# Patient Record
Sex: Female | Born: 1965 | Race: White | Hispanic: No | Marital: Single | Smoking: Former smoker
Health system: Southern US, Community
[De-identification: ages and names within clinical notes are randomized; demographics above are authoritative.]

## PROBLEM LIST (undated history)

## (undated) DIAGNOSIS — I10 Essential (primary) hypertension: Secondary | ICD-10-CM

## (undated) DIAGNOSIS — E119 Type 2 diabetes mellitus without complications: Secondary | ICD-10-CM

## (undated) DIAGNOSIS — F329 Major depressive disorder, single episode, unspecified: Secondary | ICD-10-CM

## (undated) DIAGNOSIS — J309 Allergic rhinitis, unspecified: Secondary | ICD-10-CM

## (undated) DIAGNOSIS — E669 Obesity, unspecified: Secondary | ICD-10-CM

## (undated) DIAGNOSIS — E7800 Pure hypercholesterolemia, unspecified: Secondary | ICD-10-CM

## (undated) DIAGNOSIS — D500 Iron deficiency anemia secondary to blood loss (chronic): Secondary | ICD-10-CM

## (undated) DIAGNOSIS — F32A Depression, unspecified: Secondary | ICD-10-CM

## (undated) DIAGNOSIS — F419 Anxiety disorder, unspecified: Secondary | ICD-10-CM

## (undated) DIAGNOSIS — K219 Gastro-esophageal reflux disease without esophagitis: Secondary | ICD-10-CM

## (undated) DIAGNOSIS — N189 Chronic kidney disease, unspecified: Secondary | ICD-10-CM

## (undated) HISTORY — DX: Pure hypercholesterolemia, unspecified: E78.00

## (undated) HISTORY — DX: Major depressive disorder, single episode, unspecified: F32.9

## (undated) HISTORY — PX: APPENDECTOMY: SHX54

## (undated) HISTORY — DX: Chronic kidney disease, unspecified: N18.9

## (undated) HISTORY — DX: Anxiety disorder, unspecified: F41.9

## (undated) HISTORY — DX: Iron deficiency anemia secondary to blood loss (chronic): D50.0

## (undated) HISTORY — PX: TUBAL LIGATION: SHX77

## (undated) HISTORY — DX: Essential (primary) hypertension: I10

## (undated) HISTORY — PX: CYST EXCISION: SHX5701

## (undated) HISTORY — DX: Gastro-esophageal reflux disease without esophagitis: K21.9

## (undated) HISTORY — PX: TONSILLECTOMY: SUR1361

## (undated) HISTORY — DX: Type 2 diabetes mellitus without complications: E11.9

## (undated) HISTORY — DX: Allergic rhinitis, unspecified: J30.9

## (undated) HISTORY — DX: Depression, unspecified: F32.A

## (undated) HISTORY — DX: Obesity, unspecified: E66.9

---

## 2005-01-27 ENCOUNTER — Ambulatory Visit: Payer: Self-pay | Admitting: General Practice

## 2007-10-02 ENCOUNTER — Emergency Department: Payer: Self-pay | Admitting: Emergency Medicine

## 2007-10-17 ENCOUNTER — Ambulatory Visit: Payer: Self-pay | Admitting: Internal Medicine

## 2008-01-08 ENCOUNTER — Emergency Department: Payer: Self-pay | Admitting: Emergency Medicine

## 2010-04-10 ENCOUNTER — Ambulatory Visit: Payer: Self-pay | Admitting: General Surgery

## 2010-04-10 ENCOUNTER — Ambulatory Visit: Payer: Self-pay | Admitting: Family Medicine

## 2012-10-19 ENCOUNTER — Ambulatory Visit: Payer: Self-pay

## 2013-07-22 ENCOUNTER — Emergency Department: Payer: Self-pay | Admitting: Emergency Medicine

## 2013-07-22 LAB — COMPREHENSIVE METABOLIC PANEL
Alkaline Phosphatase: 100 U/L (ref 50–136)
Anion Gap: 6 — ABNORMAL LOW (ref 7–16)
BUN: 16 mg/dL (ref 7–18)
Bilirubin,Total: 0.2 mg/dL (ref 0.2–1.0)
Calcium, Total: 9.2 mg/dL (ref 8.5–10.1)
Chloride: 106 mmol/L (ref 98–107)
Co2: 26 mmol/L (ref 21–32)
Osmolality: 278 (ref 275–301)
Total Protein: 7.2 g/dL (ref 6.4–8.2)

## 2013-07-22 LAB — TROPONIN I: Troponin-I: <0.02 ng/mL

## 2013-07-22 LAB — URINALYSIS, COMPLETE
Bacteria: NONE SEEN
Bilirubin,UR: NEGATIVE
Ketone: NEGATIVE
Leukocyte Esterase: NEGATIVE
Nitrite: NEGATIVE
Ph: 5 (ref 4.5–8.0)
Protein: NEGATIVE
RBC,UR: 5 /HPF (ref 0–5)
Squamous Epithelial: 4

## 2013-07-22 LAB — CBC
HCT: 39.5 % (ref 35.0–47.0)
HGB: 13.5 g/dL (ref 12.0–16.0)
Platelet: 303 10*3/uL (ref 150–440)
WBC: 10.2 10*3/uL (ref 3.6–11.0)

## 2013-07-22 LAB — LIPASE, BLOOD: Lipase: 90 U/L (ref 73–393)

## 2013-10-17 ENCOUNTER — Emergency Department: Payer: Self-pay | Admitting: Emergency Medicine

## 2013-11-15 ENCOUNTER — Ambulatory Visit: Payer: Self-pay

## 2013-12-01 ENCOUNTER — Ambulatory Visit (INDEPENDENT_AMBULATORY_CARE_PROVIDER_SITE_OTHER): Payer: Commercial Managed Care - PPO

## 2013-12-01 ENCOUNTER — Ambulatory Visit (INDEPENDENT_AMBULATORY_CARE_PROVIDER_SITE_OTHER): Payer: Commercial Managed Care - PPO | Admitting: Podiatry

## 2013-12-01 ENCOUNTER — Other Ambulatory Visit: Payer: Self-pay | Admitting: Podiatry

## 2013-12-01 ENCOUNTER — Encounter: Payer: Self-pay | Admitting: Podiatry

## 2013-12-01 VITALS — BP 172/89 | HR 80 | Resp 16 | Ht 62.0 in | Wt 270.0 lb

## 2013-12-01 DIAGNOSIS — M109 Gout, unspecified: Secondary | ICD-10-CM

## 2013-12-01 DIAGNOSIS — M79609 Pain in unspecified limb: Secondary | ICD-10-CM

## 2013-12-01 DIAGNOSIS — M779 Enthesopathy, unspecified: Secondary | ICD-10-CM

## 2013-12-01 DIAGNOSIS — M79672 Pain in left foot: Secondary | ICD-10-CM

## 2013-12-01 LAB — SEDIMENTATION RATE: Erythrocyte Sed Rate: 13 mm/hr (ref 0–20)

## 2013-12-01 LAB — URIC ACID: Uric Acid: 4.5 mg/dL (ref 2.6–6.0)

## 2013-12-01 MED ORDER — TRIAMCINOLONE ACETONIDE 10 MG/ML IJ SUSP
10.0000 mg | Freq: Once | INTRAMUSCULAR | Status: AC
  Administered 2013-12-01: 10 mg

## 2013-12-01 NOTE — Progress Notes (Signed)
Subjective:     Patient ID: Eileen Ritter, female   DOB: [birth year], 47 y.o.   MRN: [id number]  Foot Pain   patient presents stating I'm having a lot of pain in my feet. States it started in the right ankle and it is now in the left arch and right arch and pain across the entire foot itself. States it's been present for about 6 weeks   Review of Systems  All other systems reviewed and are negative.       Objective:   Physical Exam  Nursing note and vitals reviewed. Constitutional: She is oriented to person, place, and time.  Cardiovascular: Intact distal pulses.   Musculoskeletal: Normal range of motion.  Neurological: She is oriented to person, place, and time.  Skin: Skin is warm.   neurovascular status intact with range of motion adequate subtalar and midtarsal joint and normal muscle strength. I noted there to be tenderness in the sinus tarsi right and the ankle left with pain in the arch left over right. Swelling is also noted in a generalized fashion     Assessment:     Sinus tarsitis right and plantar fasciitis left over right mid arch along with possibility for systemic gout or arthritis    Plan:     H&P and x-rays reviewed with patient. I am sending for arthritic profile in today I injected the right ankle 3 mg Kenalog 5 of Xylocaine Marcaine mixture into the sinus tarsi and the left medial fascial band 3 mg Kenalog 5 of Xylocaine Marcaine mixture and placed on 12 day Sterapred Dosepak. Patient will be reevaluated in one week to discuss blood work results

## 2013-12-01 NOTE — Progress Notes (Signed)
   Subjective:    Patient ID: Eileen Ritter, female    DOB: [birth year], 47 y.o.   MRN: [id number]  HPI Comments: N pain L right foot arch/heel   Left foot arch D nov 2014 O sudden C better A walking, standing T pt wraps feet, soaks in hot water, ice, asper cream, icy hot, diclofenac, mobic, went to er, x-rays,    Foot Pain      Review of Systems  Constitutional: Positive for activity change.  HENT: Negative.   Eyes: Negative.   Respiratory: Negative.   Cardiovascular: Negative.   Gastrointestinal: Negative.   Endocrine: Negative.   Genitourinary: Negative.   Musculoskeletal:       Joint pain Difficulty walking  Skin: Negative.   Allergic/Immunologic: Negative.   Neurological: Negative.   Hematological: Negative.   Psychiatric/Behavioral: Negative.        Objective:   Physical Exam        Assessment & Plan:

## 2013-12-08 ENCOUNTER — Encounter: Payer: Self-pay | Admitting: Podiatry

## 2013-12-08 ENCOUNTER — Ambulatory Visit (INDEPENDENT_AMBULATORY_CARE_PROVIDER_SITE_OTHER): Payer: Commercial Managed Care - PPO | Admitting: Podiatry

## 2013-12-08 VITALS — BP 127/81 | HR 86 | Resp 16

## 2013-12-08 DIAGNOSIS — M7750 Other enthesopathy of unspecified foot: Secondary | ICD-10-CM

## 2013-12-08 NOTE — Progress Notes (Signed)
Subjective:     Patient ID: Eileen Ritter, female   DOB: 04/05/1966, 47 y.o.   MRN: [id number]  HPI patient states my feet failed so much better and I wanted to review my blood work  Review of Systems     Objective:   Physical Exam Neurovascular status intact with significant edema reduction and discomfort noted to be minimal with palpation to the sinus tarsi right and plantar heel left    Assessment:     Dramatic improvement from inflammation and pain of both feet    Plan:     Reviewed blood work indicating elevated C. reactive protein and that all other numbers were normal. Gait instructions on physical therapy supportive shoe gear in 2 reappoint if symptoms indicate

## 2013-12-08 NOTE — Progress Notes (Signed)
   Subjective:    Patient ID: Eileen Ritter, female    DOB: 10-03-1966, 47 y.o.   MRN: [id number]  HPI Comments: 100% better      Review of Systems     Objective:   Physical Exam        Assessment & Plan:

## 2014-03-30 ENCOUNTER — Ambulatory Visit (INDEPENDENT_AMBULATORY_CARE_PROVIDER_SITE_OTHER): Payer: Commercial Managed Care - PPO | Admitting: Podiatry

## 2014-03-30 VITALS — Resp 16 | Ht 63.0 in | Wt 274.0 lb

## 2014-03-30 DIAGNOSIS — M7750 Other enthesopathy of unspecified foot: Secondary | ICD-10-CM

## 2014-03-30 DIAGNOSIS — M722 Plantar fascial fibromatosis: Secondary | ICD-10-CM

## 2014-03-30 DIAGNOSIS — M7660 Achilles tendinitis, unspecified leg: Secondary | ICD-10-CM

## 2014-03-30 MED ORDER — PREDNISONE 10 MG PO KIT: 10.0000 mg | PACK | ORAL | Status: DC

## 2014-03-30 MED ORDER — TRIAMCINOLONE ACETONIDE 10 MG/ML IJ SUSP
10.0000 mg | Freq: Once | INTRAMUSCULAR | Status: AC
  Administered 2014-03-30: 10 mg

## 2014-03-30 NOTE — Patient Instructions (Signed)
Plantar Fasciitis (Heel Spur Syndrome)  with Rehab  The plantar fascia is a fibrous, ligament-like, soft-tissue structure that spans the bottom of the foot. Plantar fasciitis is a condition that causes pain in the foot due to inflammation of the tissue.  SYMPTOMS   · Pain and tenderness on the underneath side of the foot.  · Pain that worsens with standing or walking.  CAUSES   Plantar fasciitis is caused by irritation and injury to the plantar fascia on the underneath side of the foot. Common mechanisms of injury include:  · Direct trauma to bottom of the foot.  · Damage to a small nerve that runs under the foot where the main fascia attaches to the heel bone.  · Stress placed on the plantar fascia due to bone spurs.  RISK INCREASES WITH:   · Activities that place stress on the plantar fascia (running, jumping, pivoting, or cutting).  · Poor strength and flexibility.  · Improperly fitted shoes.  · Tight calf muscles.  · Flat feet.  · Failure to warm-up properly before activity.  · Obesity.  PREVENTION  · Warm up and stretch properly before activity.  · Allow for adequate recovery between workouts.  · Maintain physical fitness:  · Strength, flexibility, and endurance.  · Cardiovascular fitness.  · Maintain a health body weight.  · Avoid stress on the plantar fascia.  · Wear properly fitted shoes, including arch supports for individuals who have flat feet.  PROGNOSIS   If treated properly, then the symptoms of plantar fasciitis usually resolve without surgery. However, occasionally surgery is necessary.  RELATED COMPLICATIONS   · Recurrent symptoms that may result in a chronic condition.  · Problems of the lower back that are caused by compensating for the injury, such as limping.  · Pain or weakness of the foot during push-off following surgery.  · Chronic inflammation, scarring, and partial or complete fascia tear, occurring more often from repeated injections.  TREATMENT   Treatment initially involves the use of  ice and medication to help reduce pain and inflammation. The use of strengthening and stretching exercises may help reduce pain with activity, especially stretches of the Achilles tendon. These exercises may be performed at home or with a therapist. Your caregiver may recommend that you use heel cups of arch supports to help reduce stress on the plantar fascia. Occasionally, corticosteroid injections are given to reduce inflammation. If symptoms persist for greater than 6 months despite non-surgical (conservative), then surgery may be recommended.   MEDICATION   · If pain medication is necessary, then nonsteroidal anti-inflammatory medications, such as aspirin and ibuprofen, or other minor pain relievers, such as acetaminophen, are often recommended.  · Do not take pain medication within 7 days before surgery.  · Prescription pain relievers may be given if deemed necessary by your caregiver. Use only as directed and only as much as you need.  · Corticosteroid injections may be given by your caregiver. These injections should be reserved for the most serious cases, because they may only be given a certain number of times.  HEAT AND COLD  · Cold treatment (icing) relieves pain and reduces inflammation. Cold treatment should be applied for 10 to 15 minutes every 2 to 3 hours for inflammation and pain and immediately after any activity that aggravates your symptoms. Use ice packs or massage the area with a piece of ice (ice massage).  · Heat treatment may be used prior to performing the stretching and strengthening activities prescribed   by your caregiver, physical therapist, or athletic trainer. Use a heat pack or soak the injury in warm water.  SEEK IMMEDIATE MEDICAL CARE IF:  · Treatment seems to offer no benefit, or the condition worsens.  · Any medications produce adverse side effects.  EXERCISES  RANGE OF MOTION (ROM) AND STRETCHING EXERCISES - Plantar Fasciitis (Heel Spur Syndrome)  These exercises may help you  when beginning to rehabilitate your injury. Your symptoms may resolve with or without further involvement from your physician, physical therapist or athletic trainer. While completing these exercises, remember:   · Restoring tissue flexibility helps normal motion to return to the joints. This allows healthier, less painful movement and activity.  · An effective stretch should be held for at least 30 seconds.  · A stretch should never be painful. You should only feel a gentle lengthening or release in the stretched tissue.  RANGE OF MOTION - Toe Extension, Flexion  · Sit with your right / left leg crossed over your opposite knee.  · Grasp your toes and gently pull them back toward the top of your foot. You should feel a stretch on the bottom of your toes and/or foot.  · Hold this stretch for __________ seconds.  · Now, gently pull your toes toward the bottom of your foot. You should feel a stretch on the top of your toes and or foot.  · Hold this stretch for __________ seconds.  Repeat __________ times. Complete this stretch __________ times per day.   RANGE OF MOTION - Ankle Dorsiflexion, Active Assisted  · Remove shoes and sit on a chair that is preferably not on a carpeted surface.  · Place right / left foot under knee. Extend your opposite leg for support.  · Keeping your heel down, slide your right / left foot back toward the chair until you feel a stretch at your ankle or calf. If you do not feel a stretch, slide your bottom forward to the edge of the chair, while still keeping your heel down.  · Hold this stretch for __________ seconds.  Repeat __________ times. Complete this stretch __________ times per day.   STRETCH  Gastroc, Standing  · Place hands on wall.  · Extend right / left leg, keeping the front knee somewhat bent.  · Slightly point your toes inward on your back foot.  · Keeping your right / left heel on the floor and your knee straight, shift your weight toward the wall, not allowing your back to  arch.  · You should feel a gentle stretch in the right / left calf. Hold this position for __________ seconds.  Repeat __________ times. Complete this stretch __________ times per day.  STRETCH  Soleus, Standing  · Place hands on wall.  · Extend right / left leg, keeping the other knee somewhat bent.  · Slightly point your toes inward on your back foot.  · Keep your right / left heel on the floor, bend your back knee, and slightly shift your weight over the back leg so that you feel a gentle stretch deep in your back calf.  · Hold this position for __________ seconds.  Repeat __________ times. Complete this stretch __________ times per day.  STRETCH  Gastrocsoleus, Standing   Note: This exercise can place a lot of stress on your foot and ankle. Please complete this exercise only if specifically instructed by your caregiver.   · Place the ball of your right / left foot on a step, keeping   your other foot firmly on the same step.  · Hold on to the wall or a rail for balance.  · Slowly lift your other foot, allowing your body weight to press your heel down over the edge of the step.  · You should feel a stretch in your right / left calf.  · Hold this position for __________ seconds.  · Repeat this exercise with a slight bend in your right / left knee.  Repeat __________ times. Complete this stretch __________ times per day.   STRENGTHENING EXERCISES - Plantar Fasciitis (Heel Spur Syndrome)   These exercises may help you when beginning to rehabilitate your injury. They may resolve your symptoms with or without further involvement from your physician, physical therapist or athletic trainer. While completing these exercises, remember:   · Muscles can gain both the endurance and the strength needed for everyday activities through controlled exercises.  · Complete these exercises as instructed by your physician, physical therapist or athletic trainer. Progress the resistance and repetitions only as guided.  STRENGTH - Towel  Curls  · Sit in a chair positioned on a non-carpeted surface.  · Place your foot on a towel, keeping your heel on the floor.  · Pull the towel toward your heel by only curling your toes. Keep your heel on the floor.  · If instructed by your physician, physical therapist or athletic trainer, add ____________________ at the end of the towel.  Repeat __________ times. Complete this exercise __________ times per day.  STRENGTH - Ankle Inversion  · Secure one end of a rubber exercise band/tubing to a fixed object (table, pole). Loop the other end around your foot just before your toes.  · Place your fists between your knees. This will focus your strengthening at your ankle.  · Slowly, pull your big toe up and in, making sure the band/tubing is positioned to resist the entire motion.  · Hold this position for __________ seconds.  · Have your muscles resist the band/tubing as it slowly pulls your foot back to the starting position.  Repeat __________ times. Complete this exercises __________ times per day.   Document Released: 11/30/2005 Document Revised: 02/22/2012 Document Reviewed: 03/14/2009  [employer]® Patient Information ©2014 [employer].

## 2014-04-01 NOTE — Progress Notes (Signed)
Subjective:     Patient ID: Eileen Ritter, female   DOB: January 25, 1966, 48 y.o.   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 At this time careful injections were administered and I scanned for custom orthotics in order to lift the plantar arch

## 2014-04-12 ENCOUNTER — Encounter: Payer: Self-pay | Admitting: *Deleted

## 2014-04-12 NOTE — Progress Notes (Signed)
SENT PT POSTCARD LETTING HER KNOW ORTHOTICS ARE HERE. 

## 2014-04-20 ENCOUNTER — Ambulatory Visit (INDEPENDENT_AMBULATORY_CARE_PROVIDER_SITE_OTHER): Payer: Commercial Managed Care - PPO | Admitting: Podiatry

## 2014-04-20 VITALS — BP 136/76 | HR 85 | Resp 16

## 2014-04-20 DIAGNOSIS — M722 Plantar fascial fibromatosis: Secondary | ICD-10-CM

## 2014-04-20 NOTE — Patient Instructions (Signed)

## 2014-04-20 NOTE — Progress Notes (Signed)
Picking up orthotics and went over wearing instructions. 

## 2014-04-22 NOTE — Progress Notes (Signed)
Subjective:     Patient ID: Eileen Ritter, female   DOB: Oct 14, 1966, 48 y.o.   MRN: [id number]  HPI patient presents to pick up orthotics stating that she still is having pain but it's not as significant as previous   Review of Systems     Objective:   Physical Exam Neurovascular status intact with diminishment of discomfort in both feet with pain still noted upon palpation    Assessment:     Fasciitis tendinitis still present but improved    Plan:     Orthotics dispensed with instructions and instructed on physical therapy and supportive shoe gear usage. Reappoint her recheck

## 2014-05-04 ENCOUNTER — Ambulatory Visit: Payer: Commercial Managed Care - PPO | Admitting: Podiatry

## 2014-05-25 ENCOUNTER — Ambulatory Visit: Payer: Commercial Managed Care - PPO | Admitting: Podiatry

## 2014-06-15 ENCOUNTER — Ambulatory Visit: Payer: Commercial Managed Care - PPO | Admitting: Podiatry

## 2014-11-22 ENCOUNTER — Ambulatory Visit: Payer: Self-pay

## 2015-01-19 ENCOUNTER — Emergency Department: Payer: Self-pay | Admitting: Emergency Medicine

## 2015-05-30 DIAGNOSIS — J309 Allergic rhinitis, unspecified: Secondary | ICD-10-CM | POA: Insufficient documentation

## 2015-05-30 DIAGNOSIS — E119 Type 2 diabetes mellitus without complications: Secondary | ICD-10-CM | POA: Insufficient documentation

## 2015-05-30 DIAGNOSIS — N189 Chronic kidney disease, unspecified: Secondary | ICD-10-CM | POA: Insufficient documentation

## 2015-05-30 DIAGNOSIS — E669 Obesity, unspecified: Secondary | ICD-10-CM

## 2015-05-30 DIAGNOSIS — N181 Chronic kidney disease, stage 1: Secondary | ICD-10-CM

## 2015-05-30 DIAGNOSIS — M1710 Unilateral primary osteoarthritis, unspecified knee: Secondary | ICD-10-CM | POA: Insufficient documentation

## 2015-05-30 DIAGNOSIS — K219 Gastro-esophageal reflux disease without esophagitis: Secondary | ICD-10-CM | POA: Insufficient documentation

## 2015-05-30 DIAGNOSIS — E785 Hyperlipidemia, unspecified: Secondary | ICD-10-CM | POA: Insufficient documentation

## 2015-05-30 DIAGNOSIS — G629 Polyneuropathy, unspecified: Secondary | ICD-10-CM | POA: Insufficient documentation

## 2015-05-30 DIAGNOSIS — M179 Osteoarthritis of knee, unspecified: Secondary | ICD-10-CM | POA: Insufficient documentation

## 2015-05-30 DIAGNOSIS — I10 Essential (primary) hypertension: Secondary | ICD-10-CM | POA: Insufficient documentation

## 2015-05-30 DIAGNOSIS — F32A Depression, unspecified: Secondary | ICD-10-CM

## 2015-05-30 DIAGNOSIS — F329 Major depressive disorder, single episode, unspecified: Secondary | ICD-10-CM

## 2015-05-30 DIAGNOSIS — F419 Anxiety disorder, unspecified: Secondary | ICD-10-CM

## 2015-06-11 ENCOUNTER — Ambulatory Visit (INDEPENDENT_AMBULATORY_CARE_PROVIDER_SITE_OTHER): Payer: Commercial Managed Care - PPO | Admitting: Unknown Physician Specialty

## 2015-06-11 ENCOUNTER — Other Ambulatory Visit: Payer: Self-pay | Admitting: Unknown Physician Specialty

## 2015-06-11 ENCOUNTER — Encounter: Payer: Self-pay | Admitting: Unknown Physician Specialty

## 2015-06-11 VITALS — BP 145/87 | HR 86 | Temp 98.3°F | Ht 61.0 in | Wt 282.8 lb

## 2015-06-11 DIAGNOSIS — E119 Type 2 diabetes mellitus without complications: Secondary | ICD-10-CM | POA: Diagnosis not present

## 2015-06-11 DIAGNOSIS — Z0000 Encounter for general adult medical examination without abnormal findings: Secondary | ICD-10-CM

## 2015-06-11 DIAGNOSIS — I10 Essential (primary) hypertension: Secondary | ICD-10-CM

## 2015-06-11 LAB — BAYER DCA HB A1C WAIVED: HB A1C: 7.2 % — AB (ref <7.0)

## 2015-06-11 MED ORDER — METFORMIN HCL 500 MG PO TABS: 1000.0000 mg | ORAL_TABLET | Freq: Two times a day (BID) | ORAL | Status: DC

## 2015-06-11 MED ORDER — ATORVASTATIN CALCIUM 40 MG PO TABS: 40.0000 mg | ORAL_TABLET | Freq: Every day | ORAL | Status: DC

## 2015-06-11 MED ORDER — LOSARTAN POTASSIUM 50 MG PO TABS: 50.0000 mg | ORAL_TABLET | Freq: Every day | ORAL | Status: DC

## 2015-06-11 MED ORDER — FLUOXETINE HCL 20 MG PO TABS: 20.0000 mg | ORAL_TABLET | Freq: Every day | ORAL | Status: DC

## 2015-06-11 MED ORDER — BUPROPION HCL ER (SMOKING DET) 150 MG PO TB12: 150.0000 mg | ORAL_TABLET | Freq: Two times a day (BID) | ORAL | Status: DC

## 2015-06-11 MED ORDER — CYCLOBENZAPRINE HCL 10 MG PO TABS: 10.0000 mg | ORAL_TABLET | ORAL | Status: DC | PRN

## 2015-06-11 NOTE — Assessment & Plan Note (Signed)
Hgb A1C is 7.2.  .  Improved from previous 7.5.  Recheck in 3 months.

## 2015-06-11 NOTE — Assessment & Plan Note (Signed)
A little high today.  Job change is making it worse.  Will work on diet and exercise

## 2015-06-11 NOTE — Progress Notes (Signed)
BP 145/87 mmHg  Pulse 86  Temp(Src) 98.3 F (36.8 C)  Ht 5\' 1"  (1.549 m)  Wt 282 lb 12.8 oz (128.277 kg)  BMI 53.46 kg/m2  SpO2 97%  LMP 06/03/2015 (Exact Date)   Subjective:    Patient ID: Eileen Ritter, female    DOB: 1966/04/28, 49 y.o.   MRN: [id number]  HPI: Eileen Ritter is a 49 y.o. female  Chief Complaint  Patient presents with  . Diabetes  . Hypertension  . Hyperlipidemia  . Gynecologic Exam   .  DIABETES Hypoglycemic episodes:  no  H8  Polydipsia/polyuria:  no  H8 Visual disturbance:  no  R2  Chest pain:  no  R4  Paresthesias:  no  R10  Glucose Monitoring:  yes   Accucheck frequency:  once daily  Fasting glucose:  130s   HGB A1C: 7.5 3 months ago D1  Blood Pressure Monitoring:  not checking.  H3 Retinal Examination:  Up to date  P1 Foot Exam: Foot Exam Done on 07/18/14 P1 Diabetic Education:  completed   P1 Pneumovax:  Up to date  P1  Influenza:  Up to date  P1 Aspirin:  Aspirin Therapy Done on 09/21/12  2.  HYPERTENSION / [place] Patient is requesting refill(s). Satisfied with current treatment?  no H6  Duration of hypertension:  chronic  H4  BP monitoring frequency:  not checking.  H5  BP medication side effects:  no P1 Past BP meds:  none  P1  Duration of hyperlipidemia:  chronic  H4  Cholesterol medication side effects:  no P1  Cholesterol supplements:  none  P1 Past cholesterol medications:  none  P1 Medication compliance:  excellent compliance  P1  Aspirin:  Aspirin Therapy Done on 09/21/12 Recent stressors:  no  H6   Recurrent headaches:  no  R10 Visual changes:  no  R2  Palpitations:  no  R4  Dyspnea:  no  R5  Chest pain:  no  R4  Lower extremity edema:  no  R4  Dizzy/lightheaded:  no  R10    Due for pap.     Relevant past medical, surgical, family and social history reviewed and updated as indicated. Interim medical history since our last visit reviewed. Allergies and medications reviewed and updated.  Review of  Systems  Per HPI unless specifically indicated above     Objective:    BP 145/87 mmHg  Pulse 86  Temp(Src) 98.3 F (36.8 C)  Ht 5\' 1"  (1.549 m)  Wt 282 lb 12.8 oz (128.277 kg)  BMI 53.46 kg/m2  SpO2 97%  LMP 06/03/2015 (Exact Date)  Wt Readings from Last 3 Encounters:  06/11/15 282 lb 12.8 oz (128.277 kg)  05/30/15 281 lb (127.461 kg)  03/30/14 274 lb (124.286 kg)    Physical Exam  Constitutional: She is oriented to person, place, and time. She appears well-developed and well-nourished. No distress.  HENT:  Head: Normocephalic and atraumatic.  Eyes: Conjunctivae and lids are normal. Right eye exhibits no discharge. Left eye exhibits no discharge. No scleral icterus.  Cardiovascular: Normal rate and regular rhythm.   Pulmonary/Chest: Effort normal. No respiratory distress.  Abdominal: Normal appearance and bowel sounds are normal. She exhibits no distension. There is no splenomegaly or hepatomegaly. There is no tenderness.  Genitourinary: Vagina normal and uterus normal. There is no rash, tenderness or lesion on the right labia. There is no rash, tenderness or lesion on the left labia. Cervix exhibits no motion tenderness and  no discharge. Right adnexum displays no mass, no tenderness and no fullness. Left adnexum displays no mass, no tenderness and no fullness.  Musculoskeletal: Normal range of motion.  Neurological: She is alert and oriented to person, place, and time.  Skin: Skin is intact. No rash noted. No pallor.  Psychiatric: She has a normal mood and affect. Her behavior is normal. Judgment and thought content normal.    Results for orders placed or performed in visit on 12/01/13  Uric acid  Result Value Ref Range   Uric Acid 4.5 2.6-6.0 mg/dL  Sedimentation rate  Result Value Ref Range   Erythrocyte Sed Rate 13 0-20 mm/hr      Assessment & Plan:   Problem List Items Addressed This Visit      Cardiovascular and Mediastinum   HTN (hypertension)    A little  high today.  Job change is making it worse.  Will work on diet and exercise        Relevant Medications   atorvastatin (LIPITOR) 40 MG tablet   losartan (COZAAR) 50 MG tablet   Other Relevant Orders   Comprehensive metabolic panel     Endocrine   Diabetes - Primary    Hgb A1C is 7.2.  .  Improved from previous 7.5.  Recheck in 3 months.        Relevant Medications   atorvastatin (LIPITOR) 40 MG tablet   losartan (COZAAR) 50 MG tablet   metFORMIN (GLUCOPHAGE) 500 MG tablet   Other Relevant Orders   Bayer DCA Hb A1c Waived    Other Visit Diagnoses    Routine general medical examination at a health care facility        Relevant Orders    Pap liquid-based and HPV (high risk)        Follow up plan: Return in about 3 months (around 09/11/2015).

## 2015-06-12 LAB — COMPREHENSIVE METABOLIC PANEL
A/G RATIO: 1.7 (ref 1.1–2.5)
ALBUMIN: 3.9 g/dL (ref 3.5–5.5)
ALT: 28 IU/L (ref 0–32)
AST: 21 IU/L (ref 0–40)
Alkaline Phosphatase: 95 IU/L (ref 39–117)
BUN/Creatinine Ratio: 19 (ref 9–23)
BUN: 14 mg/dL (ref 6–24)
CALCIUM: 9 mg/dL (ref 8.7–10.2)
CO2: 24 mmol/L (ref 18–29)
CREATININE: 0.74 mg/dL (ref 0.57–1.00)
Chloride: 99 mmol/L (ref 97–108)
GFR calc Af Amer: 112 mL/min/{1.73_m2} (ref >59)
GFR, EST NON AFRICAN AMERICAN: 97 mL/min/{1.73_m2} (ref >59)
GLOBULIN, TOTAL: 2.3 g/dL (ref 1.5–4.5)
GLUCOSE: 152 mg/dL — AB (ref 65–99)
POTASSIUM: 5 mmol/L (ref 3.5–5.2)
Sodium: 138 mmol/L (ref 134–144)
TOTAL PROTEIN: 6.2 g/dL (ref 6.0–8.5)

## 2015-06-13 LAB — PAP LB AND HPV HIGH-RISK
HPV, HIGH-RISK: NEGATIVE
PAP Smear Comment: 0

## 2015-06-14 ENCOUNTER — Telehealth: Payer: Self-pay | Admitting: Unknown Physician Specialty

## 2015-06-14 NOTE — Telephone Encounter (Signed)
Pt was returning phone call and you were in a room

## 2015-06-18 ENCOUNTER — Telehealth: Payer: Self-pay

## 2015-06-18 MED ORDER — METFORMIN HCL ER (OSM) 1000 MG PO TB24
1000.0000 mg | ORAL_TABLET | Freq: Two times a day (BID) | ORAL | Status: DC
Start: 2015-06-18 — End: 2015-09-11

## 2015-06-18 NOTE — Telephone Encounter (Signed)
Patient notified

## 2015-06-18 NOTE — Telephone Encounter (Signed)
Patient called stating that she used to take metformin ER and when she picked up her prescription yesterday, they said metformin HCL. Is this okay for her to still take or does she need a new rx? Pharm is [facility] if new rx is needed.

## 2015-06-18 NOTE — Telephone Encounter (Signed)
Metformin ER sent to her pharmacy. If it is a cost issue with her insurance, she should let us know. She can take the pills she got today if she can't get to the pharmacy, otherwise take the ones just sent over.

## 2015-09-11 ENCOUNTER — Encounter: Payer: Self-pay | Admitting: Unknown Physician Specialty

## 2015-09-11 ENCOUNTER — Ambulatory Visit (INDEPENDENT_AMBULATORY_CARE_PROVIDER_SITE_OTHER): Payer: Commercial Managed Care - PPO | Admitting: Unknown Physician Specialty

## 2015-09-11 VITALS — BP 140/78 | HR 89 | Temp 98.3°F | Ht 62.1 in | Wt 289.0 lb

## 2015-09-11 DIAGNOSIS — E1122 Type 2 diabetes mellitus with diabetic chronic kidney disease: Secondary | ICD-10-CM | POA: Diagnosis not present

## 2015-09-11 DIAGNOSIS — E1165 Type 2 diabetes mellitus with hyperglycemia: Secondary | ICD-10-CM | POA: Insufficient documentation

## 2015-09-11 DIAGNOSIS — IMO0002 Reserved for concepts with insufficient information to code with codable children: Secondary | ICD-10-CM | POA: Insufficient documentation

## 2015-09-11 DIAGNOSIS — N189 Chronic kidney disease, unspecified: Secondary | ICD-10-CM

## 2015-09-11 DIAGNOSIS — F329 Major depressive disorder, single episode, unspecified: Secondary | ICD-10-CM | POA: Diagnosis not present

## 2015-09-11 DIAGNOSIS — J0100 Acute maxillary sinusitis, unspecified: Secondary | ICD-10-CM | POA: Insufficient documentation

## 2015-09-11 DIAGNOSIS — I10 Essential (primary) hypertension: Secondary | ICD-10-CM

## 2015-09-11 DIAGNOSIS — E785 Hyperlipidemia, unspecified: Secondary | ICD-10-CM

## 2015-09-11 DIAGNOSIS — K219 Gastro-esophageal reflux disease without esophagitis: Secondary | ICD-10-CM | POA: Diagnosis not present

## 2015-09-11 DIAGNOSIS — E119 Type 2 diabetes mellitus without complications: Secondary | ICD-10-CM | POA: Diagnosis not present

## 2015-09-11 DIAGNOSIS — F32A Depression, unspecified: Secondary | ICD-10-CM

## 2015-09-11 LAB — LIPID PANEL PICCOLO, WAIVED
CHOL/HDL RATIO PICCOLO,WAIVE: 3.3 mg/dL
Cholesterol Piccolo, Waived: 149 mg/dL (ref <200)
HDL Chol Piccolo, Waived: 46 mg/dL — ABNORMAL LOW (ref >59)
LDL CHOL CALC PICCOLO WAIVED: 81 mg/dL (ref <100)
Triglycerides Piccolo,Waived: 113 mg/dL (ref <150)
VLDL CHOL CALC PICCOLO,WAIVE: 23 mg/dL (ref <30)

## 2015-09-11 LAB — BAYER DCA HB A1C WAIVED: HB A1C (BAYER DCA - WAIVED): 7.6 % — ABNORMAL HIGH (ref <7.0)

## 2015-09-11 MED ORDER — AMOXICILLIN 875 MG PO TABS: 875.0000 mg | ORAL_TABLET | Freq: Two times a day (BID) | ORAL | Status: DC

## 2015-09-11 MED ORDER — BUPROPION HCL ER (SMOKING DET) 150 MG PO TB12: 150.0000 mg | ORAL_TABLET | Freq: Every day | ORAL | Status: DC

## 2015-09-11 MED ORDER — METFORMIN HCL 500 MG PO TABS: 1000.0000 mg | ORAL_TABLET | Freq: Two times a day (BID) | ORAL | Status: DC

## 2015-09-11 MED ORDER — CYCLOBENZAPRINE HCL 10 MG PO TABS: 10.0000 mg | ORAL_TABLET | ORAL | Status: AC | PRN

## 2015-09-11 MED ORDER — LOSARTAN POTASSIUM 50 MG PO TABS: 50.0000 mg | ORAL_TABLET | Freq: Every day | ORAL | Status: DC

## 2015-09-11 MED ORDER — FLUOXETINE HCL 20 MG PO TABS: 20.0000 mg | ORAL_TABLET | Freq: Every day | ORAL | Status: DC

## 2015-09-11 MED ORDER — ATORVASTATIN CALCIUM 40 MG PO TABS: 40.0000 mg | ORAL_TABLET | Freq: Every day | ORAL | Status: DC

## 2015-09-11 MED ORDER — OMEPRAZOLE 20 MG PO CPDR: 20.0000 mg | DELAYED_RELEASE_CAPSULE | Freq: Every day | ORAL | Status: DC

## 2015-09-11 NOTE — Assessment & Plan Note (Signed)
Stable prescribed 30 day supply of Omeprazole her insurance will only pay for her to take the medication once a day.  She will purchase over the counter Omeprazole so she will be able to take the medication twice daily as prescribed

## 2015-09-11 NOTE — Assessment & Plan Note (Addendum)
Reviewed HgbA1C 7.6 Pt states her appetite has increased since her Metformin was changed to extended release 3 months ago, today changed medication to Metformin 500 mg tablet take 2 tablets by mouth twice a day Will recheck HgbA1C in 3 months Pt unable to void therefore unable to obtain Microalbumin level today Comprehensive metabolic panel and Uric acid results pending

## 2015-09-11 NOTE — Assessment & Plan Note (Signed)
Prescribed Amoxicillin 875 mg tablet take twice a day for 10 days

## 2015-09-11 NOTE — Assessment & Plan Note (Signed)
Stable, continue present medications.  Comprehensive metabolic panel results pending

## 2015-09-11 NOTE — Progress Notes (Signed)
BP 140/78 mmHg  Pulse 89  Temp(Src) 98.3 F (36.8 C)  Ht 5' 2.1" (1.577 m)  Wt 289 lb (131.09 kg)  BMI 52.71 kg/m2  SpO2 97%  LMP  (LMP Unknown)   Subjective:    Patient ID: Eileen Ritter, female    DOB: 01-29-1966, 49 y.o.   MRN: [id number]  HPI: Eileen Ritter is a 49 y.o. female  Chief Complaint  Patient presents with  . Diabetes  . Hyperlipidemia  . Hypertension  . Medication Problem    pt takes omeprazole twice daily but states insurance is only going to pay for her to take it once a day. Pt states the rx she got for Metformin is not the rx she used to take and is wondering why it was changed.   Hypertension/Hyperlipidemia This is a chronic problem medication compliance is excellent.  Pt is satisfied with the current treatment.  Blood pressure improved today compared to last office visit blood pressure was 145/87.  She has stopped taking her Meloxicam which she feels has improved her blood pressure.  She does not check her blood pressure at home.  Pertinent negatives denies chest pain, headaches, edema, shortness of breath, lightheadedness, syncope, or visual disturbances.  Diabetes This is a chronic problem medication compliance is excellent.  Occasionally checks blood sugars at home fasting am sugars 130's.  Has occasional polydipsia and polyphagia she has noticed this when her metformin was changed to extended release .  Pertinent negatives polyuria, chest pain, palpitations, or shortness of breath  Sinus Pain/Cough She presents with c/o nasal congestion, cough, and sinus pain onset several days ago.  She has had sleep disturbances since her symptoms developed.  She has not taken any medications for her symptoms.  She does have a hx of seasonal allergies however unable to take her Flonase because insurance will not cover it.  Pertinent negatives denies fever, chills, sore throat, sick contacts, shortness of breath, or chest pain  Depression Pt requesting refill of  Brupropion medication compliance is excellent.  She is satisfied with the current medication the problem is stable.  PHQ-9 score 3.  Pertinent negatives denies suicidal ideation, anxiety, or confusion Depression screen PHQ 2/9 09/11/2015  Decreased Interest 1  Down, Depressed, Hopeless 0  PHQ - 2 Score 1    Relevant past medical, surgical, family and social history reviewed and updated as indicated. Interim medical history since our last visit reviewed. Allergies and medications reviewed and updated.  Review of Systems  Constitutional: Negative for fever, chills and fatigue.  Respiratory: Positive for cough and chest tightness. Negative for shortness of breath and wheezing.        Recently had a cold cough has lingered  Cardiovascular: Negative for chest pain and leg swelling.       Hx of palpitations  Gastrointestinal: Negative for nausea, vomiting, abdominal pain and diarrhea.  Endocrine: Positive for polydipsia and polyphagia. Negative for polyuria.  Musculoskeletal:       Hx of joint pain  Skin: Negative for rash.  Neurological: Negative for dizziness, syncope, weakness, light-headedness and headaches.       Occasional numbness of hands and feet  Psychiatric/Behavioral: Negative for suicidal ideas, hallucinations, confusion and agitation. The patient is not nervous/anxious.     Per HPI unless specifically indicated above     Objective:    BP 140/78 mmHg  Pulse 89  Temp(Src) 98.3 F (36.8 C)  Ht 5' 2.1" (1.577 m)  Wt 289 lb (131.09 kg)  BMI 52.71 kg/m2  SpO2 97%  LMP  (LMP Unknown)  Wt Readings from Last 3 Encounters:  09/11/15 289 lb (131.09 kg)  06/11/15 282 lb 12.8 oz (128.277 kg)  05/30/15 281 lb (127.461 kg)    Physical Exam  Constitutional: She is oriented to person, place, and time. She appears well-developed and well-nourished. No distress.  HENT:  Head: Normocephalic and atraumatic.  Right Ear: Hearing, tympanic membrane, external ear and ear canal normal.  No drainage.  Left Ear: Hearing, tympanic membrane, external ear and ear canal normal. No drainage.  Nose: Mucosal edema present. Right sinus exhibits maxillary sinus tenderness. Right sinus exhibits no frontal sinus tenderness. Left sinus exhibits maxillary sinus tenderness. Left sinus exhibits no frontal sinus tenderness.  Mouth/Throat: Uvula is midline, oropharynx is clear and moist and mucous membranes are normal.  Neck: Normal range of motion. Neck supple.  Cardiovascular: Normal rate, regular rhythm and normal heart sounds.   Pulmonary/Chest: Effort normal and breath sounds normal. No respiratory distress. She has no wheezes.  Musculoskeletal: Normal range of motion. She exhibits no edema.  Lymphadenopathy:    She has no cervical adenopathy.  Neurological: She is alert and oriented to person, place, and time.  Skin: Skin is warm and dry. She is not diaphoretic. No erythema. No pallor.  Psychiatric: She has a normal mood and affect. Her behavior is normal. Judgment and thought content normal.    Results for orders placed or performed in visit on 06/11/15  Comprehensive metabolic panel  Result Value Ref Range   Glucose 152 (H) 65 - 99 mg/dL   BUN 14 6 - 24 mg/dL   Creatinine, Ser 0.74 0.57 - 1.00 mg/dL   GFR calc non Af Amer 97 >59 mL/min/1.73   GFR calc Af Amer 112 >59 mL/min/1.73   BUN/Creatinine Ratio 19 9 - 23   Sodium 138 134 - 144 mmol/L   Potassium 5.0 3.5 - 5.2 mmol/L   Chloride 99 97 - 108 mmol/L   CO2 24 18 - 29 mmol/L   Calcium 9.0 8.7 - 10.2 mg/dL   Total Protein 6.2 6.0 - 8.5 g/dL   Albumin 3.9 3.5 - 5.5 g/dL   Globulin, Total 2.3 1.5 - 4.5 g/dL   Albumin/Globulin Ratio 1.7 1.1 - 2.5   Bilirubin Total <0.2 0.0 - 1.2 mg/dL   Alkaline Phosphatase 95 39 - 117 IU/L   AST 21 0 - 40 IU/L   ALT 28 0 - 32 IU/L  Pap liquid-based and HPV (high risk)  Result Value Ref Range   DIAGNOSIS: Comment    Specimen adequacy: Comment    CLINICIAN PROVIDED ICD10: Comment     Performed by: Comment    PAP SMEAR COMMENT .    Note: Comment    HPV, high-risk Negative Negative      Assessment & Plan:   Problem List Items Addressed This Visit      Unprioritized   GERD (gastroesophageal reflux disease)    Stable prescribed 30 day supply of Omeprazole her insurance will only pay for her to take the medication once a day.  She will purchase over the counter Omeprazole so she will be able to take the medication twice daily as prescribed      Relevant Medications   Probiotic Product (PROBIOTIC PO)   omeprazole (PRILOSEC) 20 MG capsule   Diabetes - Primary   Relevant Medications   metFORMIN (GLUCOPHAGE) 500 MG tablet   atorvastatin (LIPITOR) 40 MG tablet   losartan (COZAAR) 50  MG tablet   HTN (hypertension)    Stable, continue present medications.  Comprehensive metabolic panel results pending      Relevant Medications   atorvastatin (LIPITOR) 40 MG tablet   losartan (COZAAR) 50 MG tablet   Other Relevant Orders   Comprehensive metabolic panel   Hyperlipidemia    Lipid Panel Reviewed stable, continue current medications      Relevant Medications   atorvastatin (LIPITOR) 40 MG tablet   losartan (COZAAR) 50 MG tablet   Other Relevant Orders   Lipid Panel Piccolo, Waived   Depression    Stable, continue current medications      Relevant Medications   FLUoxetine (PROZAC) 20 MG tablet   Type 2 diabetes mellitus with chronic kidney disease    Reviewed HgbA1C 7.6 Pt states her appetite has increased since her Metformin was changed to extended release 3 months ago, today changed medication to Metformin 500 mg tablet take 2 tablets by mouth twice a day Will recheck HgbA1C in 3 months Pt unable to void therefore unable to obtain Microalbumin level today Comprehensive metabolic panel and Uric acid results pending      Relevant Medications   metFORMIN (GLUCOPHAGE) 500 MG tablet   atorvastatin (LIPITOR) 40 MG tablet   losartan (COZAAR) 50 MG tablet    Other Relevant Orders   Bayer DCA Hb A1c Waived   Microalbumin, Urine Waived   Uric acid   Comprehensive metabolic panel   Sinusitis, acute, maxillary    Prescribed Amoxicillin 875 mg tablet take twice a day for 10 days      Relevant Medications   amoxicillin (AMOXIL) 875 MG tablet       Follow up plan: Return in about 3 months (around 12/11/2015) for Hgb A1C.

## 2015-09-11 NOTE — Assessment & Plan Note (Signed)
Lipid Panel Reviewed stable, continue current medications

## 2015-09-11 NOTE — Assessment & Plan Note (Addendum)
Stable, continue current medications.  

## 2015-09-12 LAB — COMPREHENSIVE METABOLIC PANEL
A/G RATIO: 1.6 (ref 1.1–2.5)
ALT: 39 IU/L — AB (ref 0–32)
AST: 20 IU/L (ref 0–40)
Albumin: 3.9 g/dL (ref 3.5–5.5)
Alkaline Phosphatase: 100 IU/L (ref 39–117)
BUN/Creatinine Ratio: 16 (ref 9–23)
BUN: 12 mg/dL (ref 6–24)
Bilirubin Total: 0.3 mg/dL (ref 0.0–1.2)
CALCIUM: 9.5 mg/dL (ref 8.7–10.2)
CO2: 22 mmol/L (ref 18–29)
CREATININE: 0.75 mg/dL (ref 0.57–1.00)
Chloride: 98 mmol/L (ref 97–108)
GFR calc Af Amer: 110 mL/min/{1.73_m2} (ref >59)
GFR, EST NON AFRICAN AMERICAN: 95 mL/min/{1.73_m2} (ref >59)
Globulin, Total: 2.4 g/dL (ref 1.5–4.5)
Glucose: 156 mg/dL — ABNORMAL HIGH (ref 65–99)
POTASSIUM: 4.6 mmol/L (ref 3.5–5.2)
Sodium: 140 mmol/L (ref 134–144)
Total Protein: 6.3 g/dL (ref 6.0–8.5)

## 2015-09-12 LAB — URIC ACID: URIC ACID: 5.1 mg/dL (ref 2.5–7.1)

## 2015-09-16 ENCOUNTER — Telehealth: Payer: Self-pay | Admitting: Unknown Physician Specialty

## 2015-09-16 MED ORDER — METFORMIN HCL ER (OSM) 1000 MG PO TB24: 2000.0000 mg | ORAL_TABLET | Freq: Every day | ORAL | Status: DC

## 2015-09-16 NOTE — Telephone Encounter (Signed)
Routing to provider. Is the patient supposed to be on regular or er metformin?

## 2015-09-16 NOTE — Telephone Encounter (Signed)
Pt called stated her Metformin RX is supposed to be 500mg  with 4 pills per day, states the RX needs to be changed to reflect this. Pt stated she needs the pill should be the fast acting Metformin not the 24 hour stuff. Pharm is [facility] on [address]. Please call with any issues or concerns. Thanks.

## 2015-09-16 NOTE — Telephone Encounter (Signed)
ER is fine.  I wrote a new rx

## 2015-09-16 NOTE — Telephone Encounter (Signed)
Pt wanted to have the er version of Metformin 500 mg rx but that isnt what was sent over and [facility] would like to know which to [facility]

## 2015-09-16 NOTE — Telephone Encounter (Signed)
Called pharmacy to make sure they got the new rx for the ER Metformin. They stated they did.

## 2015-09-16 NOTE — Telephone Encounter (Signed)
Routing to provider. [facility]acy is [facility] [address].

## 2015-09-23 ENCOUNTER — Ambulatory Visit: Payer: Commercial Managed Care - PPO | Admitting: Unknown Physician Specialty

## 2015-11-21 ENCOUNTER — Encounter: Payer: Self-pay | Admitting: Unknown Physician Specialty

## 2015-12-11 ENCOUNTER — Ambulatory Visit: Payer: Commercial Managed Care - PPO | Admitting: Unknown Physician Specialty

## 2015-12-24 ENCOUNTER — Ambulatory Visit: Payer: Commercial Managed Care - PPO | Admitting: Unknown Physician Specialty

## 2016-01-14 ENCOUNTER — Ambulatory Visit (INDEPENDENT_AMBULATORY_CARE_PROVIDER_SITE_OTHER): Payer: Commercial Managed Care - PPO | Admitting: Unknown Physician Specialty

## 2016-01-14 ENCOUNTER — Encounter: Payer: Self-pay | Admitting: Unknown Physician Specialty

## 2016-01-14 VITALS — BP 141/85 | HR 87 | Temp 97.8°F | Ht 61.0 in | Wt 292.8 lb

## 2016-01-14 DIAGNOSIS — E785 Hyperlipidemia, unspecified: Secondary | ICD-10-CM

## 2016-01-14 DIAGNOSIS — I10 Essential (primary) hypertension: Secondary | ICD-10-CM

## 2016-01-14 DIAGNOSIS — E119 Type 2 diabetes mellitus without complications: Secondary | ICD-10-CM | POA: Diagnosis not present

## 2016-01-14 LAB — LIPID PANEL PICCOLO, WAIVED
CHOL/HDL RATIO PICCOLO,WAIVE: 3.1 mg/dL
CHOLESTEROL PICCOLO, WAIVED: 141 mg/dL (ref <200)
HDL CHOL PICCOLO, WAIVED: 46 mg/dL — AB (ref >59)
LDL CHOL CALC PICCOLO WAIVED: 72 mg/dL (ref <100)
TRIGLYCERIDES PICCOLO,WAIVED: 116 mg/dL (ref <150)
VLDL CHOL CALC PICCOLO,WAIVE: 23 mg/dL (ref <30)

## 2016-01-14 LAB — MICROALBUMIN, URINE WAIVED
CREATININE, URINE WAIVED: 200 mg/dL (ref 10–300)
Microalb, Ur Waived: 10 mg/L (ref 0–19)

## 2016-01-14 LAB — BAYER DCA HB A1C WAIVED: HB A1C (BAYER DCA - WAIVED): 8.5 % — ABNORMAL HIGH (ref <7.0)

## 2016-01-14 MED ORDER — EXENATIDE ER 2 MG ~~LOC~~ PEN: 2.0000 mg | PEN_INJECTOR | SUBCUTANEOUS | Status: DC

## 2016-01-14 MED ORDER — METFORMIN HCL 500 MG PO TABS: 1000.0000 mg | ORAL_TABLET | Freq: Two times a day (BID) | ORAL | Status: DC

## 2016-01-14 NOTE — Assessment & Plan Note (Signed)
Pt with an elevated HgA1C 8.5%. Pt changed to short-acting Metformin and started on Bydureon injection, once per week. Performed teaching on how to give subQ injections. Pt demonstrated self injection. Suggestion made to increase physical activity and manage diet with low carbs and sugars.

## 2016-01-14 NOTE — Progress Notes (Signed)
BP 141/85 mmHg  Pulse 87  Temp(Src) 97.8 F (36.6 C)  Ht 5\' 1"  (1.549 m)  Wt 292 lb 12.8 oz (132.813 kg)  BMI 55.35 kg/m2  SpO2 97%  LMP 01/09/2016 (Exact Date)   Subjective:    Patient ID: Eileen Ritter, female    DOB: [birth year], 50 y.o.   MRN: [id number]  HPI: Eileen Ritter is a 50 y.o. female  Chief Complaint  Patient presents with  . Diabetes  . Hyperlipidemia  . Hypertension     Pt present for f/u for DM, HLD, and HTN. Pt sts that she has been tolerating her medications at home w/o difficulty. Pt has noted a 20 lb weight gain over the last 3-4 months. Per pt sts that she has had a change in jobs with less activity. Pt denies changes in her diet. Pt sts that she is attempting to improve her eating habits. Denies polyuria, polydispsia, or polyphagia at home. Denies changes in vision; last eye appt was December 2015. Pt has noted increased burning sensation in hands and feet, bilaterally. Pt denies taking her Gabapentin that was prescribed. Pt has been taking Ibuprofen for pain relief. Pt does not take her BP at home. Denies frequent headaches, dizziness, or near syncope at home. Denies CP or SOB. Pt denies SOB with exertion. No peripheral edema reported.  Diabetes:  Using medications without difficulties No hypoglycemic episodes: none No hyperglycemic episodes:none Feet problems: yes; increased burning sensation  Blood Sugars averaging:130-160  Eye exam Dec 2015  Hypertension:  Using medications without difficulty  Using medication without problems or lightheadedness No chest pain with exertion or shortness of breath No Edema  Elevated Cholesterol: Using medications without problems: No Muscle aches. Diet compliance: pt is not compliant to a diet.  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       Relevant past medical, surgical, family and social history reviewed and updated as indicated. Interim medical history since our last  visit reviewed. Allergies and medications reviewed and updated.  Review of Systems  Constitutional: Positive for activity change and unexpected weight change.  Eyes: Negative for visual disturbance.  Respiratory: Negative for chest tightness and shortness of breath.   Cardiovascular: Negative for chest pain and leg swelling.  Gastrointestinal: Negative for nausea, vomiting and abdominal pain.  Endocrine: Negative for polydipsia, polyphagia and polyuria.  Genitourinary: Negative for frequency and difficulty urinating.  Musculoskeletal: Positive for arthralgias. Negative for myalgias.  Skin: Negative.   Neurological: Positive for numbness. Negative for light-headedness and headaches.  Hematological: Negative.   Psychiatric/Behavioral: Negative.     Per HPI unless specifically indicated above     Objective:    BP 141/85 mmHg  Pulse 87  Temp(Src) 97.8 F (36.6 C)  Ht 5\' 1"  (1.549 m)  Wt 292 lb 12.8 oz (132.813 kg)  BMI 55.35 kg/m2  SpO2 97%  LMP 01/09/2016 (Exact Date)  Wt Readings from Last 3 Encounters:  01/14/16 292 lb 12.8 oz (132.813 kg)  09/11/15 289 lb (131.09 kg)  06/11/15 282 lb 12.8 oz (128.277 kg)    Physical Exam  Constitutional: She is oriented to person, place, and time. She appears well-developed and well-nourished. No distress.  HENT:  Head: Normocephalic and atraumatic.  Eyes: Conjunctivae and lids are normal. Right eye exhibits no discharge. Left eye exhibits no discharge. No scleral icterus.  Neck: Normal range of motion. Neck supple. No JVD present. Carotid bruit is not present.  Cardiovascular: Normal rate, regular rhythm,  S1 normal, S2 normal and normal heart sounds.   Pulmonary/Chest: Effort normal and breath sounds normal.  Abdominal: Soft. Normal appearance. There is no splenomegaly or hepatomegaly. There is no tenderness.  Musculoskeletal: Normal range of motion.  Neurological: She is alert and oriented to person, place, and time.  Skin: Skin is  warm, dry and intact. No rash noted. No pallor.  Psychiatric: She has a normal mood and affect. Her behavior is normal. Judgment and thought content normal.   Diabetic Foot Exam - Simple   Simple Foot Form  Visual Inspection  No deformities, no ulcerations, no other skin breakdown bilaterally:  Yes  Sensation Testing  Intact to touch and monofilament testing bilaterally:  Yes  See comments:  Yes  Pulse Check  Posterior Tibialis and Dorsalis pulse intact bilaterally:  Yes  Comments  Pt had +8 monofilament in bilateral feet.  Reduced sensation, described as dull, at the arch and heel of her feet.        Results for orders placed or performed in visit on 09/11/15  Bayer DCA Hb A1c Waived  Result Value Ref Range   Bayer DCA Hb A1c Waived 7.6 (H) <7.0 %  Lipid Panel Piccolo, Waived  Result Value Ref Range   Cholesterol Piccolo, Waived 149 <200 mg/dL   HDL Chol Piccolo, Waived 46 (L) >59 mg/dL   Triglycerides Piccolo,Waived 113 <150 mg/dL   Chol/HDL Ratio Piccolo,Waive 3.3 mg/dL   LDL Chol Calc Piccolo Waived 81 <100 mg/dL   VLDL Chol Calc Piccolo,Waive 23 <30 mg/dL  Uric acid  Result Value Ref Range   Uric Acid 5.1 2.5 - 7.1 mg/dL  Comprehensive metabolic panel  Result Value Ref Range   Glucose 156 (H) 65 - 99 mg/dL   BUN 12 6 - 24 mg/dL   Creatinine, Ser 0.75 0.57 - 1.00 mg/dL   GFR calc non Af Amer 95 >59 mL/min/1.73   GFR calc Af Amer 110 >59 mL/min/1.73   BUN/Creatinine Ratio 16 9 - 23   Sodium 140 134 - 144 mmol/L   Potassium 4.6 3.5 - 5.2 mmol/L   Chloride 98 97 - 108 mmol/L   CO2 22 18 - 29 mmol/L   Calcium 9.5 8.7 - 10.2 mg/dL   Total Protein 6.3 6.0 - 8.5 g/dL   Albumin 3.9 3.5 - 5.5 g/dL   Globulin, Total 2.4 1.5 - 4.5 g/dL   Albumin/Globulin Ratio 1.6 1.1 - 2.5   Bilirubin Total 0.3 0.0 - 1.2 mg/dL   Alkaline Phosphatase 100 39 - 117 IU/L   AST 20 0 - 40 IU/L   ALT 39 (H) 0 - 32 IU/L      Assessment & Plan:   Problem List Items Addressed This Visit       Unprioritized   Diabetes ([place]) - Primary    Pt with an elevated HgA1C 8.5%. Pt changed to short-acting Metformin and started on Bydureon injection, once per week. Performed teaching on how to give subQ injections. Pt demonstrated self injection. Suggestion made to increase physical activity and manage diet with low carbs and sugars.       Relevant Medications   Exenatide ER (BYDUREON) 2 MG PEN   metFORMIN (GLUCOPHAGE) 500 MG tablet   Other Relevant Orders   Comprehensive metabolic panel   Lipid Panel Piccolo, Waived   Bayer DCA Hb A1c Waived   Microalbumin, Urine Waived   HTN (hypertension)    BP controlled at this time, with medications. No complaints reported from pt.  Pt instructed to periodically take her BP at home to note ranges.       Relevant Orders   Comprehensive metabolic panel   Lipid Panel Piccolo, Waived   Hyperlipidemia    Lipid panel level within in normal limits. Pt instructed to continue taking medication, as prescribed.       Relevant Orders   Lipid Panel Piccolo, Waived       Follow up plan: Return in about 3 months (around 04/12/2016).

## 2016-01-14 NOTE — Assessment & Plan Note (Signed)
BP controlled at this time, with medications. No complaints reported from pt.  Pt instructed to periodically take her BP at home to note ranges.

## 2016-01-14 NOTE — Assessment & Plan Note (Signed)
Pt instructed to start taking her Gabapentin, previously prescribed, to help with symptoms. Also, stressed the importance of controlling daily glucose at home.

## 2016-01-14 NOTE — Assessment & Plan Note (Signed)
Lipid panel level within in normal limits. Pt instructed to continue taking medication, as prescribed.

## 2016-01-15 ENCOUNTER — Telehealth: Payer: Self-pay | Admitting: Unknown Physician Specialty

## 2016-01-15 LAB — COMPREHENSIVE METABOLIC PANEL WITH GFR
ALT: 18 IU/L (ref 0–32)
AST: 11 IU/L (ref 0–40)
Albumin/Globulin Ratio: 1.8 (ref 1.1–2.5)
Albumin: 3.9 g/dL (ref 3.5–5.5)
Alkaline Phosphatase: 99 IU/L (ref 39–117)
BUN/Creatinine Ratio: 13 (ref 9–23)
BUN: 9 mg/dL (ref 6–24)
Bilirubin Total: 0.3 mg/dL (ref 0.0–1.2)
CO2: 23 mmol/L (ref 18–29)
Calcium: 9 mg/dL (ref 8.7–10.2)
Chloride: 98 mmol/L (ref 96–106)
Creatinine, Ser: 0.72 mg/dL (ref 0.57–1.00)
GFR calc Af Amer: 115 mL/min/1.73
GFR calc non Af Amer: 100 mL/min/1.73
Globulin, Total: 2.2 g/dL (ref 1.5–4.5)
Glucose: 188 mg/dL — ABNORMAL HIGH (ref 65–99)
Potassium: 5 mmol/L (ref 3.5–5.2)
Sodium: 137 mmol/L (ref 134–144)
Total Protein: 6.1 g/dL (ref 6.0–8.5)

## 2016-01-15 NOTE — Telephone Encounter (Signed)
Called and left patient a voicemail asking for her to please return my call.  

## 2016-01-15 NOTE — Telephone Encounter (Signed)
Routing to provider  

## 2016-01-15 NOTE — Telephone Encounter (Signed)
Please ask her to look at her formulary and check if another medication in that class is covered.  I don't know what her insurance covers.  The class is GLP1. She can bring it here and let us look.

## 2016-01-15 NOTE — Telephone Encounter (Signed)
Patient returned call. I asked for patient to contact her insurance and see what medications are in the same class as Bydureon that they will cover. I also asked the patient to give [place] a call back when she knows what can be covered by insurance.

## 2016-01-15 NOTE — Telephone Encounter (Signed)
Patient called stating that the medication Exenatide ER (BYDUREON) 2 MG PEN is to expensive for her and needs something different. Please call patient, thanks.

## 2016-01-20 MED ORDER — METFORMIN HCL 1000 MG PO TABS
1000.0000 mg | ORAL_TABLET | Freq: Two times a day (BID) | ORAL | Status: DC
Start: 2016-01-20 — End: 2016-01-20

## 2016-01-20 MED ORDER — METFORMIN HCL ER 500 MG PO TB24: 500.0000 mg | ORAL_TABLET | Freq: Every day | ORAL | Status: DC

## 2016-01-20 NOTE — Telephone Encounter (Signed)
I called her and asked if she can bring her formulary over so we can look at it.

## 2016-01-20 NOTE — Telephone Encounter (Signed)
Called to check in with patient about her medication but she did not answer so I left her a voicemail asking for her to please return my call.

## 2016-01-20 NOTE — Telephone Encounter (Signed)
Patient returned call. She states she called her insurance company and they stated there was no alternative for the bydureon. Patient states she got really scared about her blood sugars in the last few days. States she called the pharmacy and they told her see if now on Metformin HCL and used to be on Metformin ER. Patient says that her A1C is higher on the Metformin HCL so the patient would like to be switched back to the Metformin ER. She states she was on Metformin ER 500 mg 4 times daily. I wanted to get clarification about the medications so I asked the patient if she was saying she did not want to do the bydureon and only be switched back to the metformin she used to be on and she stated yes.

## 2016-01-21 MED ORDER — METFORMIN HCL ER 500 MG PO TB24: 2000.0000 mg | ORAL_TABLET | Freq: Every day | ORAL | Status: DC

## 2016-01-21 NOTE — Telephone Encounter (Signed)
Sent in a new rx

## 2016-01-21 NOTE — Telephone Encounter (Signed)
Called and let patient know that we were doing a PA request for the bydureon. Patient states she did not pick up the metformin that was sent in yesterday because it only says to take one tablet when she has usually taken 4 tablets. Malachy Mood is what you sent in correct?

## 2016-01-21 NOTE — Telephone Encounter (Signed)
Patient had formulary faxed over to [place]. Malachy Mood looked at it and saw that bydureon was on the list so she said we should do a PA. So I called the pharmacy and they are faxing over the PA request.

## 2016-01-21 NOTE — Telephone Encounter (Signed)
Called patient and let her know that new prescription was sent in.

## 2016-01-23 NOTE — Telephone Encounter (Signed)
Called pharmacy to get them to fax the PA request. The lady stated that it is not showing in their system as needing a PA because insurance is paying some on the medication. But what's left after insurance is $91 and the patient said that was to much for her to pay. I still have the formulary for this patient, so I think we need to try something that would be cheaper for the patient that's on the formulary.

## 2016-01-23 NOTE — Telephone Encounter (Signed)
For a patient with insurance, there is typically a card in the cabinet or in [place] lowering her copay.  Can we find one?

## 2016-01-24 NOTE — Telephone Encounter (Signed)
Called and spoke to patient while Malachy Mood was standing beside me. We do have a savings card that the patient is going to try and we are giving her two samples of Bydureon. She stated the last savings card we gave her did not work. We advised her that we would try to get some new savings cards when the bydureon drug rep came by. Patient stated she would come by and pick up samples later today.

## 2016-02-04 ENCOUNTER — Encounter: Payer: Self-pay | Admitting: Unknown Physician Specialty

## 2016-02-04 ENCOUNTER — Telehealth: Payer: Self-pay | Admitting: Unknown Physician Specialty

## 2016-02-04 MED ORDER — CANAGLIFLOZIN 100 MG PO TABS: 100.0000 mg | ORAL_TABLET | Freq: Every day | ORAL | Status: DC

## 2016-02-04 NOTE — Telephone Encounter (Signed)
Pt called stated her numbers are still elevated. Sugar level this morning was 199. Pt stated she does not feel that medication is working. Please call her ASAP. Thanks.

## 2016-02-04 NOTE — Telephone Encounter (Signed)
I would still like her to continue the Bydureon.  I will add Invokanna 100 mg but she sill need a savings card.  Let her know the Dede Query will make her pee a lot, drink extra water, and a vaginal yeast infection is the most common side-effect.

## 2016-02-04 NOTE — Telephone Encounter (Signed)
Called and gave patient information that Malachy Mood said. She stated she was coming by tomorrow to pick up savings cards.

## 2016-02-04 NOTE — Telephone Encounter (Signed)
Called and left patient a voicemail asking for her to please return my call.  

## 2016-02-06 ENCOUNTER — Encounter: Payer: Self-pay | Admitting: Unknown Physician Specialty

## 2016-02-20 ENCOUNTER — Other Ambulatory Visit: Payer: Self-pay | Admitting: Unknown Physician Specialty

## 2016-04-05 ENCOUNTER — Other Ambulatory Visit: Payer: Self-pay | Admitting: Unknown Physician Specialty

## 2016-04-14 ENCOUNTER — Ambulatory Visit (INDEPENDENT_AMBULATORY_CARE_PROVIDER_SITE_OTHER): Payer: Commercial Managed Care - PPO | Admitting: Unknown Physician Specialty

## 2016-04-14 ENCOUNTER — Encounter: Payer: Self-pay | Admitting: Unknown Physician Specialty

## 2016-04-14 VITALS — BP 138/80 | HR 85 | Temp 97.7°F | Ht 61.5 in | Wt 278.6 lb

## 2016-04-14 DIAGNOSIS — E1122 Type 2 diabetes mellitus with diabetic chronic kidney disease: Secondary | ICD-10-CM | POA: Diagnosis not present

## 2016-04-14 LAB — BAYER DCA HB A1C WAIVED: HB A1C: 7.6 % — AB (ref <7.0)

## 2016-04-14 MED ORDER — CANAGLIFLOZIN 300 MG PO TABS: 300.0000 mg | ORAL_TABLET | Freq: Every day | ORAL | Status: DC

## 2016-04-14 NOTE — Assessment & Plan Note (Addendum)
Hgb A1Cis now 7.6 down from 8.5.  Increase Invokanna to 300 mg.  Coaching done for diet and exercise

## 2016-04-14 NOTE — Progress Notes (Signed)
BP 138/80 mmHg  Pulse 85  Temp(Src) 97.7 F (36.5 C)  Ht 5' 1.5" (1.562 m)  Wt 278 lb 9.6 oz (126.372 kg)  BMI 51.80 kg/m2  SpO2 96%  LMP 03/14/2016 (Approximate)   Subjective:    Patient ID: Eileen Ritter, female    DOB: [birth year], 50 y.o.   MRN: [id number]  HPI: Eileen Ritter is a 50 y.o. female  Chief Complaint  Patient presents with  . Diabetes    pt states she knows she is due for an eye exam  . Hyperlipidemia  . Hypertension   Diabetes:  Just taking Invokana and Metformin.  States that she was unable to take Bydureon Using medications without difficulties No hypoglycemic episodes Feet problems: none Blood Sugars averaging: 180 in the AM eye exam within last year: will make an appointment Last Hgb A1C 8.5  Hypertension:  Using medications without difficulty Using medication without problems or lightheadedness No chest pain with exertion or shortness of breath No Edema  Elevated Cholesterol: Using medications without problems No Muscle aches Diet compliance: Not good as tried to take and extra job Exercise: none  Relevant past medical, surgical, family and social history reviewed and updated as indicated. Interim medical history since our last visit reviewed. Allergies and medications reviewed and updated.  Review of Systems  Per HPI unless specifically indicated above     Objective:    BP 138/80 mmHg  Pulse 85  Temp(Src) 97.7 F (36.5 C)  Ht 5' 1.5" (1.562 m)  Wt 278 lb 9.6 oz (126.372 kg)  BMI 51.80 kg/m2  SpO2 96%  LMP 03/14/2016 (Approximate)  Wt Readings from Last 3 Encounters:  04/14/16 278 lb 9.6 oz (126.372 kg)  01/14/16 292 lb 12.8 oz (132.813 kg)  09/11/15 289 lb (131.09 kg)    Physical Exam  Constitutional: She is oriented to person, place, and time. She appears well-developed and well-nourished. No distress.  HENT:  Head: Normocephalic and atraumatic.  Eyes: Conjunctivae and lids are normal. Right eye exhibits no  discharge. Left eye exhibits no discharge. No scleral icterus.  Neck: Normal range of motion. Neck supple. No JVD present. Carotid bruit is not present.  Cardiovascular: Normal rate, regular rhythm and normal heart sounds.   Pulmonary/Chest: Effort normal and breath sounds normal.  Abdominal: Normal appearance. There is no splenomegaly or hepatomegaly.  Musculoskeletal: Normal range of motion.  Neurological: She is alert and oriented to person, place, and time.  Skin: Skin is warm, dry and intact. No rash noted. No pallor.  Psychiatric: She has a normal mood and affect. Her behavior is normal. Judgment and thought content normal.    Results for orders placed or performed in visit on 01/14/16  Comprehensive metabolic panel  Result Value Ref Range   Glucose 188 (H) 65 - 99 mg/dL   BUN 9 6 - 24 mg/dL   Creatinine, Ser 0.72 0.57 - 1.00 mg/dL   GFR calc non Af Amer 100 >59 mL/min/1.73   GFR calc Af Amer 115 >59 mL/min/1.73   BUN/Creatinine Ratio 13 9 - 23   Sodium 137 134 - 144 mmol/L   Potassium 5.0 3.5 - 5.2 mmol/L   Chloride 98 96 - 106 mmol/L   CO2 23 18 - 29 mmol/L   Calcium 9.0 8.7 - 10.2 mg/dL   Total Protein 6.1 6.0 - 8.5 g/dL   Albumin 3.9 3.5 - 5.5 g/dL   Globulin, Total 2.2 1.5 - 4.5 g/dL   Albumin/Globulin Ratio 1.8 1.1 -  2.5   Bilirubin Total 0.3 0.0 - 1.2 mg/dL   Alkaline Phosphatase 99 39 - 117 IU/L   AST 11 0 - 40 IU/L   ALT 18 0 - 32 IU/L  Lipid Panel Piccolo, Waived  Result Value Ref Range   Cholesterol Piccolo, Waived 141 <200 mg/dL   HDL Chol Piccolo, Waived 46 (L) >59 mg/dL   Triglycerides Piccolo,Waived 116 <150 mg/dL   Chol/HDL Ratio Piccolo,Waive 3.1 mg/dL   LDL Chol Calc Piccolo Waived 72 <100 mg/dL   VLDL Chol Calc Piccolo,Waive 23 <30 mg/dL  Bayer DCA Hb A1c Waived  Result Value Ref Range   Bayer DCA Hb A1c Waived 8.5 (H) <7.0 %  Microalbumin, Urine Waived  Result Value Ref Range   Microalb, Ur Waived 10 0 - 19 mg/L   Creatinine, Urine Waived 200  10 - 300 mg/dL   Microalb/Creat Ratio <30 <30 mg/g      Assessment & Plan:   Problem List Items Addressed This Visit      Unprioritized   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  Increase Invokanna to 300 mg.  Coaching done for diet and exercise      Relevant Medications   canagliflozin (INVOKANA) 300 MG TABS tablet   Other Relevant Orders   Comprehensive metabolic panel   Bayer DCA Hb A1c Waived       Follow up plan: Return in about 3 months (around 07/15/2016).

## 2016-04-15 LAB — COMPREHENSIVE METABOLIC PANEL
A/G RATIO: 1.5 (ref 1.2–2.2)
ALK PHOS: 93 IU/L (ref 39–117)
ALT: 20 IU/L (ref 0–32)
AST: 18 IU/L (ref 0–40)
Albumin: 4.1 g/dL (ref 3.5–5.5)
BILIRUBIN TOTAL: 0.3 mg/dL (ref 0.0–1.2)
BUN/Creatinine Ratio: 21 (ref 9–23)
BUN: 14 mg/dL (ref 6–24)
CHLORIDE: 99 mmol/L (ref 96–106)
CO2: 22 mmol/L (ref 18–29)
Calcium: 9.6 mg/dL (ref 8.7–10.2)
Creatinine, Ser: 0.67 mg/dL (ref 0.57–1.00)
GFR calc non Af Amer: 104 mL/min/{1.73_m2} (ref >59)
GFR, EST AFRICAN AMERICAN: 120 mL/min/{1.73_m2} (ref >59)
GLUCOSE: 138 mg/dL — AB (ref 65–99)
Globulin, Total: 2.7 g/dL (ref 1.5–4.5)
POTASSIUM: 4.8 mmol/L (ref 3.5–5.2)
Sodium: 139 mmol/L (ref 134–144)
TOTAL PROTEIN: 6.8 g/dL (ref 6.0–8.5)

## 2016-05-02 ENCOUNTER — Other Ambulatory Visit: Payer: Self-pay | Admitting: Unknown Physician Specialty

## 2016-05-06 ENCOUNTER — Encounter: Payer: Self-pay | Admitting: Unknown Physician Specialty

## 2016-06-16 ENCOUNTER — Other Ambulatory Visit: Payer: Self-pay | Admitting: Unknown Physician Specialty

## 2016-06-17 ENCOUNTER — Other Ambulatory Visit: Payer: Self-pay | Admitting: Unknown Physician Specialty

## 2016-06-23 ENCOUNTER — Encounter: Payer: Commercial Managed Care - PPO | Admitting: Unknown Physician Specialty

## 2016-07-13 ENCOUNTER — Other Ambulatory Visit: Payer: Self-pay | Admitting: Unknown Physician Specialty

## 2016-07-28 ENCOUNTER — Ambulatory Visit (INDEPENDENT_AMBULATORY_CARE_PROVIDER_SITE_OTHER): Payer: Commercial Managed Care - PPO | Admitting: Unknown Physician Specialty

## 2016-07-28 ENCOUNTER — Other Ambulatory Visit: Payer: Self-pay

## 2016-07-28 ENCOUNTER — Encounter: Payer: Self-pay | Admitting: Unknown Physician Specialty

## 2016-07-28 VITALS — BP 136/83 | HR 77 | Temp 97.6°F | Ht 62.1 in | Wt 270.8 lb

## 2016-07-28 DIAGNOSIS — E1122 Type 2 diabetes mellitus with diabetic chronic kidney disease: Secondary | ICD-10-CM

## 2016-07-28 DIAGNOSIS — I10 Essential (primary) hypertension: Secondary | ICD-10-CM | POA: Diagnosis not present

## 2016-07-28 DIAGNOSIS — E785 Hyperlipidemia, unspecified: Secondary | ICD-10-CM

## 2016-07-28 DIAGNOSIS — E119 Type 2 diabetes mellitus without complications: Secondary | ICD-10-CM | POA: Diagnosis not present

## 2016-07-28 LAB — LIPID PANEL PICCOLO, WAIVED
CHOL/HDL RATIO PICCOLO,WAIVE: 3.6 mg/dL
CHOLESTEROL PICCOLO, WAIVED: 169 mg/dL (ref <200)
HDL Chol Piccolo, Waived: 46 mg/dL — ABNORMAL LOW (ref >59)
LDL Chol Calc Piccolo Waived: 98 mg/dL (ref <100)
Triglycerides Piccolo,Waived: 121 mg/dL (ref <150)
VLDL Chol Calc Piccolo,Waive: 24 mg/dL (ref <30)

## 2016-07-28 LAB — MICROALBUMIN, URINE WAIVED
CREATININE, URINE WAIVED: 50 mg/dL (ref 10–300)
Microalb, Ur Waived: 10 mg/L (ref 0–19)

## 2016-07-28 LAB — HEMOGLOBIN A1C: Hemoglobin A1C: 7.4

## 2016-07-28 LAB — BAYER DCA HB A1C WAIVED: HB A1C: 7.4 % — AB (ref <7.0)

## 2016-07-28 MED ORDER — BUPROPION HCL ER (SR) 150 MG PO TB12: 150.0000 mg | ORAL_TABLET | Freq: Every day | ORAL | 1 refills | Status: DC

## 2016-07-28 MED ORDER — OMEPRAZOLE 20 MG PO CPDR: 20.0000 mg | DELAYED_RELEASE_CAPSULE | Freq: Every day | ORAL | 1 refills | Status: DC

## 2016-07-28 MED ORDER — METFORMIN HCL 500 MG PO TABS: 1000.0000 mg | ORAL_TABLET | Freq: Two times a day (BID) | ORAL | 1 refills | Status: DC

## 2016-07-28 MED ORDER — CANAGLIFLOZIN 300 MG PO TABS: 300.0000 mg | ORAL_TABLET | Freq: Every day | ORAL | 3 refills | Status: DC

## 2016-07-28 NOTE — Assessment & Plan Note (Signed)
Stable, continue present medications.   

## 2016-07-28 NOTE — Assessment & Plan Note (Signed)
Hgb A1C is 7.4 which is an improvement.  Pt thinks this will continue to come down

## 2016-07-28 NOTE — Progress Notes (Signed)
BP 136/83 (BP Location: Left Arm, Patient Position: Sitting, Cuff Size: Large)   Pulse 77   Temp 97.6 F (36.4 C)   Ht 5' 2.1" (1.577 m) Comment: pt had shoes on  Wt 270 lb 12.8 oz (122.8 kg) Comment: pt had shoes on  SpO2 97%   BMI 49.37 kg/m    Subjective:    Patient ID: Eileen Ritter, female    DOB: 05-14-1966, 50 y.o.   MRN: [id number]  HPI: Eileen Ritter is a 49 y.o. female  Chief Complaint  Patient presents with  . Diabetes    pt states she has eye exam scheduled for 08/11/16  . Hyperlipidemia  . Hypertension   Diabetes: Using medications without difficulties.   No hypoglycemic episodes No hyperglycemic episodes Feet problems:none Blood Sugars averaging: 140s in the AM eye exam within last year Last Hgb A1C: 7.6  Hypertension  Using medications without difficulty Average home BPs Not checking   Using medication without problems or lightheadedness No chest pain with exertion or shortness of breath No Edema  Elevated Cholesterol Using medications without problems No Muscle aches  Diet: States she eats well Exercise: At work  Relevant past medical, surgical, family and social history reviewed and updated as indicated. Interim medical history since our last visit reviewed. Allergies and medications reviewed and updated.  Review of Systems  Per HPI unless specifically indicated above     Objective:    BP 136/83 (BP Location: Left Arm, Patient Position: Sitting, Cuff Size: Large)   Pulse 77   Temp 97.6 F (36.4 C)   Ht 5' 2.1" (1.577 m) Comment: pt had shoes on  Wt 270 lb 12.8 oz (122.8 kg) Comment: pt had shoes on  SpO2 97%   BMI 49.37 kg/m   Wt Readings from Last 3 Encounters:  07/28/16 270 lb 12.8 oz (122.8 kg)  04/14/16 278 lb 9.6 oz (126.4 kg)  01/14/16 292 lb 12.8 oz (132.8 kg)    Physical Exam  Constitutional: She is oriented to person, place, and time. She appears well-developed and well-nourished. No distress.  HENT:  Head:  Normocephalic and atraumatic.  Eyes: Conjunctivae and lids are normal. Right eye exhibits no discharge. Left eye exhibits no discharge. No scleral icterus.  Neck: Normal range of motion. Neck supple. No JVD present. Carotid bruit is not present.  Cardiovascular: Normal rate, regular rhythm and normal heart sounds.   Pulmonary/Chest: Effort normal and breath sounds normal.  Abdominal: Normal appearance. There is no splenomegaly or hepatomegaly.  Musculoskeletal: Normal range of motion.  Neurological: She is alert and oriented to person, place, and time.  Skin: Skin is warm, dry and intact. No rash noted. No pallor.  Psychiatric: She has a normal mood and affect. Her behavior is normal. Judgment and thought content normal.    Results for orders placed or performed in visit on 04/14/16  Comprehensive metabolic panel  Result Value Ref Range   Glucose 138 (H) 65 - 99 mg/dL   BUN 14 6 - 24 mg/dL   Creatinine, Ser 0.67 0.57 - 1.00 mg/dL   GFR calc non Af Amer 104 >59 mL/min/1.73   GFR calc Af Amer 120 >59 mL/min/1.73   BUN/Creatinine Ratio 21 9 - 23   Sodium 139 134 - 144 mmol/L   Potassium 4.8 3.5 - 5.2 mmol/L   Chloride 99 96 - 106 mmol/L   CO2 22 18 - 29 mmol/L   Calcium 9.6 8.7 - 10.2 mg/dL   Total Protein  6.8 6.0 - 8.5 g/dL   Albumin 4.1 3.5 - 5.5 g/dL   Globulin, Total 2.7 1.5 - 4.5 g/dL   Albumin/Globulin Ratio 1.5 1.2 - 2.2   Bilirubin Total 0.3 0.0 - 1.2 mg/dL   Alkaline Phosphatase 93 39 - 117 IU/L   AST 18 0 - 40 IU/L   ALT 20 0 - 32 IU/L  Bayer DCA Hb A1c Waived  Result Value Ref Range   Bayer DCA Hb A1c Waived 7.6 (H) <7.0 %      Assessment & Plan:   Problem List Items Addressed This Visit      Unprioritized   Diabetes ([place]) - Primary   Relevant Medications   metFORMIN (GLUCOPHAGE) 500 MG tablet   canagliflozin (INVOKANA) 300 MG TABS tablet   Other Relevant Orders   Bayer DCA Hb A1c Waived   HTN (hypertension)    Stable, continue present medications.          Relevant Orders   Comprehensive metabolic panel   Microalbumin, Urine Waived   Uric acid   Hyperlipidemia    Stable, continue present medications.        Relevant Orders   Lipid Panel Piccolo, Waived   Type 2 diabetes mellitus with chronic kidney disease ([facility])    Hgb A1C is 7.4 which is an improvement.  Pt thinks this will continue to come down      Relevant Medications   metFORMIN (GLUCOPHAGE) 500 MG tablet   canagliflozin (INVOKANA) 300 MG TABS tablet    Other Visit Diagnoses   None.      Follow up plan: Return in about 3 months (around 10/28/2016).

## 2016-07-29 LAB — COMPREHENSIVE METABOLIC PANEL
A/G RATIO: 2 (ref 1.2–2.2)
ALBUMIN: 4.3 g/dL (ref 3.5–5.5)
ALT: 16 IU/L (ref 0–32)
AST: 15 IU/L (ref 0–40)
Alkaline Phosphatase: 82 IU/L (ref 39–117)
BILIRUBIN TOTAL: 0.3 mg/dL (ref 0.0–1.2)
BUN / CREAT RATIO: 19 (ref 9–23)
BUN: 13 mg/dL (ref 6–24)
CALCIUM: 9.4 mg/dL (ref 8.7–10.2)
CHLORIDE: 99 mmol/L (ref 96–106)
CO2: 23 mmol/L (ref 18–29)
Creatinine, Ser: 0.67 mg/dL (ref 0.57–1.00)
GFR, EST AFRICAN AMERICAN: 120 mL/min/{1.73_m2} (ref >59)
GFR, EST NON AFRICAN AMERICAN: 104 mL/min/{1.73_m2} (ref >59)
GLOBULIN, TOTAL: 2.2 g/dL (ref 1.5–4.5)
Glucose: 118 mg/dL — ABNORMAL HIGH (ref 65–99)
POTASSIUM: 5.1 mmol/L (ref 3.5–5.2)
Sodium: 140 mmol/L (ref 134–144)
TOTAL PROTEIN: 6.5 g/dL (ref 6.0–8.5)

## 2016-07-29 LAB — URIC ACID: Uric Acid: 4.1 mg/dL (ref 2.5–7.1)

## 2016-08-31 ENCOUNTER — Encounter: Payer: Self-pay | Admitting: Unknown Physician Specialty

## 2016-08-31 ENCOUNTER — Ambulatory Visit (INDEPENDENT_AMBULATORY_CARE_PROVIDER_SITE_OTHER): Payer: Commercial Managed Care - PPO | Admitting: Unknown Physician Specialty

## 2016-08-31 VITALS — BP 138/78 | HR 80 | Temp 97.8°F | Ht 60.6 in | Wt 273.2 lb

## 2016-08-31 DIAGNOSIS — Z0000 Encounter for general adult medical examination without abnormal findings: Secondary | ICD-10-CM | POA: Diagnosis not present

## 2016-08-31 NOTE — Progress Notes (Signed)
   BP 138/78 (BP Location: Left Arm, Patient Position: Sitting, Cuff Size: Large)   Pulse 80   Temp 97.8 F (36.6 C)   Ht 5' 0.6" (1.539 m)   Wt 273 lb 3.2 oz (123.9 kg)   LMP 08/16/2016 (Exact Date)   SpO2 96%   BMI 52.30 kg/m    Subjective:    Patient ID: Eileen Ritter, female    DOB: [birth year], 50 y.o.   MRN: [id number]  HPI: Eileen Ritter is a 50 y.o. female  Chief Complaint  Patient presents with  . Annual Exam   Chronic medical conditions managed last month.  She is here for her physical only.  Only complaint is a respiratory illness.    Relevant past medical, surgical, family and social history reviewed and updated as indicated. Interim medical history since our last visit reviewed. Allergies and medications reviewed and updated.  Review of Systems  Constitutional: Negative.   HENT: Negative.   Eyes: Negative.   Respiratory: Negative.   Cardiovascular: Negative.   Gastrointestinal: Negative.   Endocrine: Negative.   Genitourinary: Negative.   Musculoskeletal: Negative.   Skin: Negative.   Allergic/Immunologic: Negative.   Neurological: Negative.   Hematological: Negative.   Psychiatric/Behavioral: Negative.     Per HPI unless specifically indicated above     Objective:    BP 138/78 (BP Location: Left Arm, Patient Position: Sitting, Cuff Size: Large)   Pulse 80   Temp 97.8 F (36.6 C)   Ht 5' 0.6" (1.539 m)   Wt 273 lb 3.2 oz (123.9 kg)   LMP 08/16/2016 (Exact Date)   SpO2 96%   BMI 52.30 kg/m   Wt Readings from Last 3 Encounters:  08/31/16 273 lb 3.2 oz (123.9 kg)  07/28/16 270 lb 12.8 oz (122.8 kg)  04/14/16 278 lb 9.6 oz (126.4 kg)    Physical Exam  Constitutional: She is oriented to person, place, and time. She appears well-developed and well-nourished. No distress.  HENT:  Head: Normocephalic and atraumatic.  Eyes: Conjunctivae and lids are normal. Right eye exhibits no discharge. Left eye exhibits no discharge. No scleral icterus.    Neck: Normal range of motion. Neck supple. No JVD present. Carotid bruit is not present.  Cardiovascular: Normal rate, regular rhythm and normal heart sounds.   Pulmonary/Chest: Effort normal and breath sounds normal.  Abdominal: Normal appearance. There is no splenomegaly or hepatomegaly.  Musculoskeletal: Normal range of motion.  Neurological: She is alert and oriented to person, place, and time.  Skin: Skin is warm, dry and intact. No rash noted. No pallor.  Psychiatric: She has a normal mood and affect. Her behavior is normal. Judgment and thought content normal.    Results for orders placed or performed in visit on 07/28/16  Hemoglobin A1c  Result Value Ref Range   Hemoglobin A1C 7.4       Assessment & Plan:   Problem List Items Addressed This Visit    None    Visit Diagnoses    Routine general medical examination at a health care facility    -  Primary   Relevant Orders   MM DIGITAL SCREENING BILATERAL       Follow up plan: Return in about 2 months (around 10/31/2016).

## 2016-08-31 NOTE — Patient Instructions (Signed)
Please do call to schedule your mammogram; the number to schedule one at either [facility] or [facility] is [phone]   

## 2016-09-09 ENCOUNTER — Encounter: Payer: Self-pay | Admitting: Unknown Physician Specialty

## 2016-09-17 ENCOUNTER — Other Ambulatory Visit: Payer: Self-pay | Admitting: Unknown Physician Specialty

## 2016-10-02 NOTE — Telephone Encounter (Signed)
Your patient 

## 2016-10-22 ENCOUNTER — Other Ambulatory Visit: Payer: Self-pay

## 2016-10-26 ENCOUNTER — Other Ambulatory Visit: Payer: Self-pay

## 2016-10-26 ENCOUNTER — Ambulatory Visit (INDEPENDENT_AMBULATORY_CARE_PROVIDER_SITE_OTHER): Payer: Commercial Managed Care - PPO | Admitting: Unknown Physician Specialty

## 2016-10-26 ENCOUNTER — Encounter: Payer: Self-pay | Admitting: Unknown Physician Specialty

## 2016-10-26 VITALS — BP 136/80 | HR 84 | Temp 98.2°F | Ht 62.1 in | Wt 271.0 lb

## 2016-10-26 DIAGNOSIS — E1122 Type 2 diabetes mellitus with diabetic chronic kidney disease: Secondary | ICD-10-CM | POA: Diagnosis not present

## 2016-10-26 DIAGNOSIS — I10 Essential (primary) hypertension: Secondary | ICD-10-CM | POA: Diagnosis not present

## 2016-10-26 DIAGNOSIS — E782 Mixed hyperlipidemia: Secondary | ICD-10-CM | POA: Diagnosis not present

## 2016-10-26 DIAGNOSIS — E119 Type 2 diabetes mellitus without complications: Secondary | ICD-10-CM

## 2016-10-26 LAB — HEMOGLOBIN A1C: Hemoglobin A1C: 6.9

## 2016-10-26 LAB — BAYER DCA HB A1C WAIVED: HB A1C: 6.9 % (ref <7.0)

## 2016-10-26 NOTE — Assessment & Plan Note (Signed)
Stable, continue present medications.   

## 2016-10-26 NOTE — Assessment & Plan Note (Signed)
Hgb A1C is 6.9.  Continue present treatment

## 2016-10-26 NOTE — Progress Notes (Signed)
BP 136/80 (BP Location: Left Arm, Patient Position: Sitting, Cuff Size: Large)   Pulse 84   Temp 98.2 F (36.8 C)   Ht 5' 2.1" (1.577 m)   Wt 271 lb (122.9 kg)   LMP 10/11/2016 (Approximate)   SpO2 96%   BMI 49.41 kg/m    Subjective:    Patient ID: Eileen Ritter, female    DOB: May 13, 1966, 50 y.o.   MRN: [id number]  HPI: Eileen Ritter is a 50 y.o. female  Chief Complaint  Patient presents with  . Depression  . Diabetes  . Hyperlipidemia  . Hypertension   Diabetes: Using medications without difficulties No hypoglycemic episodes No hyperglycemic episodes Feet problems:none Blood Sugars averaging:Went back to "normal" after finishing antibiotics and typically less with 130.   eye exam within last year Last Hgb A1C: 7.4  Hypertension  Using medications without difficulty Average home BPs Not checking   Using medication without problems or lightheadedness No chest pain with exertion or shortness of breath No Edema  Elevated Cholesterol Using medications without problems No Muscle aches  Diet: /Exercise: doing better and trying to walk more  Relevant past medical, surgical, family and social history reviewed and updated as indicated. Interim medical history since our last visit reviewed. Allergies and medications reviewed and updated.  Review of Systems  Per HPI unless specifically indicated above     Objective:    BP 136/80 (BP Location: Left Arm, Patient Position: Sitting, Cuff Size: Large)   Pulse 84   Temp 98.2 F (36.8 C)   Ht 5' 2.1" (1.577 m)   Wt 271 lb (122.9 kg)   LMP 10/11/2016 (Approximate)   SpO2 96%   BMI 49.41 kg/m   Wt Readings from Last 3 Encounters:  10/26/16 271 lb (122.9 kg)  08/31/16 273 lb 3.2 oz (123.9 kg)  07/28/16 270 lb 12.8 oz (122.8 kg)    Physical Exam  Constitutional: She is oriented to person, place, and time. She appears well-developed and well-nourished. No distress.  HENT:  Head: Normocephalic and atraumatic.    Eyes: Conjunctivae and lids are normal. Right eye exhibits no discharge. Left eye exhibits no discharge. No scleral icterus.  Neck: Normal range of motion. Neck supple. No JVD present. Carotid bruit is not present.  Cardiovascular: Normal rate, regular rhythm and normal heart sounds.   Pulmonary/Chest: Effort normal and breath sounds normal.  Abdominal: Normal appearance. There is no splenomegaly or hepatomegaly.  Musculoskeletal: Normal range of motion.  Neurological: She is alert and oriented to person, place, and time.  Skin: Skin is warm, dry and intact. No rash noted. No pallor.  Psychiatric: She has a normal mood and affect. Her behavior is normal. Judgment and thought content normal.    Results for orders placed or performed in visit on 07/28/16  Hemoglobin A1c  Result Value Ref Range   Hemoglobin A1C 7.4       Assessment & Plan:   Problem List Items Addressed This Visit      Unprioritized   Diabetes ([place]) - Primary   Relevant Orders   Bayer DCA Hb A1c Waived   Comprehensive metabolic panel   HTN (hypertension)    Stable, continue present medications.        Relevant Orders   Comprehensive metabolic panel   Hyperlipidemia    Stable, continue present medications.        Type 2 diabetes mellitus with chronic kidney disease ([facility])    Hgb A1C is 6.9.  Continue  present treatment          Follow up plan: Return in about 6 months (around 04/25/2017).

## 2016-10-27 ENCOUNTER — Encounter: Payer: Self-pay | Admitting: Unknown Physician Specialty

## 2016-10-27 LAB — COMPREHENSIVE METABOLIC PANEL
A/G RATIO: 1.8 (ref 1.2–2.2)
ALT: 20 IU/L (ref 0–32)
AST: 15 IU/L (ref 0–40)
Albumin: 4.1 g/dL (ref 3.5–5.5)
Alkaline Phosphatase: 80 IU/L (ref 39–117)
BUN/Creatinine Ratio: 19 (ref 9–23)
BUN: 13 mg/dL (ref 6–24)
Bilirubin Total: 0.2 mg/dL (ref 0.0–1.2)
CALCIUM: 9.2 mg/dL (ref 8.7–10.2)
CO2: 20 mmol/L (ref 18–29)
CREATININE: 0.67 mg/dL (ref 0.57–1.00)
Chloride: 100 mmol/L (ref 96–106)
GFR, EST AFRICAN AMERICAN: 120 mL/min/{1.73_m2} (ref >59)
GFR, EST NON AFRICAN AMERICAN: 104 mL/min/{1.73_m2} (ref >59)
GLOBULIN, TOTAL: 2.3 g/dL (ref 1.5–4.5)
Glucose: 139 mg/dL — ABNORMAL HIGH (ref 65–99)
POTASSIUM: 4.7 mmol/L (ref 3.5–5.2)
Sodium: 140 mmol/L (ref 134–144)
TOTAL PROTEIN: 6.4 g/dL (ref 6.0–8.5)

## 2016-11-14 ENCOUNTER — Other Ambulatory Visit: Payer: Self-pay | Admitting: Unknown Physician Specialty

## 2016-11-17 ENCOUNTER — Other Ambulatory Visit: Payer: Self-pay | Admitting: Unknown Physician Specialty

## 2016-12-01 ENCOUNTER — Ambulatory Visit
Admission: RE | Admit: 2016-12-01 | Discharge: 2016-12-01 | Disposition: A | Discharge status: Home / Self Care | Payer: Commercial Managed Care - PPO | Source: Ambulatory Visit | Attending: Unknown Physician Specialty | Admitting: Unknown Physician Specialty

## 2016-12-01 DIAGNOSIS — Z1231 Encounter for screening mammogram for malignant neoplasm of breast: Secondary | ICD-10-CM | POA: Diagnosis not present

## 2016-12-01 DIAGNOSIS — Z0000 Encounter for general adult medical examination without abnormal findings: Secondary | ICD-10-CM

## 2016-12-14 ENCOUNTER — Other Ambulatory Visit: Payer: Self-pay | Admitting: Unknown Physician Specialty

## 2016-12-18 ENCOUNTER — Ambulatory Visit (INDEPENDENT_AMBULATORY_CARE_PROVIDER_SITE_OTHER): Payer: Commercial Managed Care - PPO | Admitting: Unknown Physician Specialty

## 2016-12-18 ENCOUNTER — Encounter: Payer: Self-pay | Admitting: Unknown Physician Specialty

## 2016-12-18 VITALS — BP 138/85 | HR 73 | Temp 97.9°F | Wt 274.8 lb

## 2016-12-18 DIAGNOSIS — I10 Essential (primary) hypertension: Secondary | ICD-10-CM

## 2016-12-18 DIAGNOSIS — N951 Menopausal and female climacteric states: Secondary | ICD-10-CM | POA: Diagnosis not present

## 2016-12-18 DIAGNOSIS — M170 Bilateral primary osteoarthritis of knee: Secondary | ICD-10-CM

## 2016-12-18 DIAGNOSIS — L659 Nonscarring hair loss, unspecified: Secondary | ICD-10-CM

## 2016-12-18 MED ORDER — MELOXICAM 15 MG PO TABS: 15.0000 mg | ORAL_TABLET | Freq: Every day | ORAL | 0 refills | Status: DC

## 2016-12-18 NOTE — Progress Notes (Signed)
   BP 138/85 (BP Location: Left Arm, Patient Position: Sitting, Cuff Size: Large)   Pulse 73   Temp 97.9 F (36.6 C)   Wt 274 lb 12.8 oz (124.6 kg)   LMP 12/13/2016 (Exact Date) Comment: Pt states no chance of preg  SpO2 97%   BMI 50.10 kg/m    Subjective:    Patient ID: Eileen Ritter, female    DOB: [birth year], 51 y.o.   MRN: [id number]  HPI: Eileen Ritter is a 51 y.o. female  Chief Complaint  Patient presents with  . Alopecia    pt states she has has noticed her hair thinning recently   . Pain    pt states pain from arthritis is affecting her life, patient states she has had this pain for years now   Pt is here with CC of her hair "falling out in atronomical amounts."  This is general when she combs it or pulls it back it comes out in her hands.  Denies any stressfuo periods in the last 6 months.    States menstrual cycle is erratic, lasting 10 days some months or not having it at all. Describes significant hot flashes and cramps before the period comes.    States arthritic pain is worsening.  Left knee and hands are bothering and it is getting worse and effecting day to day life.  Exercise includes walking at work and going up and down steps.  States she was tested by podiatry in the past for arthritis.  She takes about 1200 mg of Ibuprofen and she takes Tyleonol  Relevant past medical, surgical, family and social history reviewed and updated as indicated. Interim medical history since our last visit reviewed. Allergies and medications reviewed and updated.  Review of Systems  Per HPI unless specifically indicated above     Objective:    BP 138/85 (BP Location: Left Arm, Patient Position: Sitting, Cuff Size: Large)   Pulse 73   Temp 97.9 F (36.6 C)   Wt 274 lb 12.8 oz (124.6 kg)   LMP 12/13/2016 (Exact Date) Comment: Pt states no chance of preg  SpO2 97%   BMI 50.10 kg/m   Wt Readings from Last 3 Encounters:  12/18/16 274 lb 12.8 oz (124.6 kg)  10/26/16 271 lb  (122.9 kg)  08/31/16 273 lb 3.2 oz (123.9 kg)    Physical Exam  Results for orders placed or performed in visit on 10/26/16  Hemoglobin A1c  Result Value Ref Range   Hemoglobin A1C 6.9%       Assessment & Plan:   Problem List Items Addressed This Visit      Unprioritized   Arthritis of knee, degenerative   Relevant Medications   meloxicam (MOBIC) 15 MG tablet   Hair thinning   Relevant Orders   Prolactin   Thyroid Panel With TSH   HTN (hypertension)    Other Visit Diagnoses    Peri-menopausal    -  Primary   Relevant Orders   FSH   LH     Pt ed on arthritis pain.     Follow up plan: Return in about 4 weeks (around 01/15/2017).

## 2016-12-18 NOTE — Patient Instructions (Addendum)
f you have chronic pain and are looking for alternatives to medication and surgery, you have a lot of options.   However, not all alternative pain treatments work. Some can even be risky. Some alternative treatments may help with pain from bad backs, osteoarthritis, and headaches, but have no effect on chronic pain from fibromyalgia or diabetic nerve damage.  Here's a rundown of the most commonly used alternative treatments for chronic pain.  Acupuncture. Once seen as bizarre, acupuncture is rapidly becoming a mainstream treatment for pain. Studies have found that it works for pain caused by many conditions, including fibromyalgia, osteoarthritis, back injuries, and sports injuries. How does it work? No one's quite sure. It could release pain-numbing chemicals in the body. Or it might block the pain signals coming from the nerves.  Exercise. Motion is lotion Going for a walk or swim isn't a treatment, exactly. But regular physical activity has big benefits for people with many different painful conditions. Study after study has found that physical activity can help relieve chronic pain, as well as boost energy and mood.   This is particularly true of pain related to arthritis .    Chiropractic manipulation. Although mainstream medicine has traditionally regarded spinal manipulation with suspicion, it's becoming a more accepted treatment.  I think many people respond will th chiropractic treatment.    Supplements and vitamins. There is evidence that certain dietary supplements and vitamins can help with certain types of pain. Fish oil and flaxseed oil is often used to reduce pain associated with swelling. Topical capsaicin, derived from chili peppers, may help with arthritis, diabetic nerve pain, and other conditions. There's evidence that glucosamine can help relieve moderate to severe pain from osteoarthritis in the knee.  A spice Tumeric is used my many to treat chronic pain.  Curcumin is the active  ingredient and thought to have anti-inflammatory effects and reduces pain and stiffness.  This can be found as capsules or extract (more likely to be free of contaminants). For OA: Capsule, typically 400 mg to 600 mg, three times per day; or 0.5 g to 1 g of powdered root up to 3 g per day. For RA: 500 mg twice daily.  It's best absorbed if it contains black pepper.  Tart cherry juice is a natural anti-inflammatory agent.  We sometimes hear from people who would like to know where to find cherries out of season. Some report that their local market does not carry cherry juice. There are a number of reputable online vendors who could supply cherry concentrate so you can use cherry juice to see if it helps your arthritic joints. Studies have been done with powdered [place] cherries, CherryPURE. The usual dose is 480 mg/day. Manufacturers often combine several ingredients in 1 pill.  One of my patient is taking [employer] Fib[employer] support she finds helpful.  Another I know is Zyflamed.  Please let me know if you find any products helpful for you that I can pass on to others  But when it comes to supplements, you have to be careful. High doses of B6 can damage the nerves.   Some studies suggest that supplements such as ginkgo biloba and ginseng can thin the blood and increase the risk of bleeding. This could lead to serious consequences for anyone getting surgery for chronic pain.  Finally, supplements don't always contain what they claim as supplement manufacturers are not regulated by the FDA.  I get very confused with the thousands of supplements available and which to  choose.  Brands to consider: [employer], Nature[employer], [employer], Garden of Life, [employer], Duwayne Heck and Tresa Garter   I usually go on [employer] and look for the highly rated products.  [employer] or [employer] are great resources and have done the research for you.     Cognitive Behavioral Therapy. Some people with chronic pain  balk at the idea of seeing a therapist -- they think it implies that their pain isn't real. But studies show that depression and chronic pain often go together. Chronic pain can cause or worsen depression; depression can lower a person's tolerance for pain.  Stress-reduction techniques. There are number of approaches, including: Yoga. There's good evidence that yoga can help with chronic pain,  specifically fibromyalgia, neck pain, back pain, and arthritis. Relaxation therapy. This is actually a category of techniques that help people calm the body and release tension -- a process that might also reduce pain. Some approaches teach people how to focus on their breathing. Research shows that relaxation therapy can help with fibromyalgia, headache, osteoarthritis, and other conditions. Hypnosis. Studies have found this approach helpful with different sorts of pain, like back pain, repetitive strain injuries, and cancer pain. Guided imagery. Research shows that guided imagery can help with conditions like headache pain, cancer pain, osteoarthritis, and fibromyalgia. How does it work? An expert would teach you ways to direct your thoughts by focusing on specific images. Music therapy. This approach gets people to either perform or listen to music. Studies have found that it can help with many different pain conditions, like osteoarthritis and cancer pain. Biofeedback. This approach teaches you how to control normally unconscious bodily functions, like blood pressure or your heart rate. Studies have found that it can help with headaches, fibromyalgia, and other conditions. Massage. It's undeniably relaxing. And there's some evidence that massage can help ease pain from rheumatoid arthritis, neck and back injuries, and fibromyalgia.  Risky Alternative Pain Treatments Experts say you should keep your expectations for alternative pain treatments modest -- especially when it comes to "miracle cures." Controlling  chronic pain is not simple. A single supplement, device, or treatment is not going to make your chronic pain disappear. You a need to be suspicious of anyone pushing a treatment when the financial motive is blatant. That doesn't only apply to pain treatments advertised on dubious web sites asking for your credit card number.  Experts say that you should try to keep up to date with research into alternative treatments for chronic pain. The options for people with chronic pain are always growing -- and some of the older treatments of today might become the mainstream treatments of tomorrow.  ---------------------------------------------------  Consumer lab tells me Drs Luvenia Heller is best deal on Tumeric Look for [employer] Juice concentrate Gin soaked raisins Fish oil- [employer] is good - aim for 1,000 mgs of EPA and DHA

## 2016-12-19 LAB — THYROID PANEL WITH TSH
FREE THYROXINE INDEX: 1.8 (ref 1.2–4.9)
T3 UPTAKE RATIO: 26 % (ref 24–39)
T4, Total: 7 ug/dL (ref 4.5–12.0)
TSH: 3.09 u[IU]/mL (ref 0.450–4.500)

## 2016-12-19 LAB — LUTEINIZING HORMONE: LH: 9.6 m[IU]/mL

## 2016-12-19 LAB — FOLLICLE STIMULATING HORMONE: FSH: 19.1 m[IU]/mL

## 2016-12-19 LAB — PROLACTIN: PROLACTIN: 9.1 ng/mL (ref 4.8–23.3)

## 2017-01-13 ENCOUNTER — Other Ambulatory Visit: Payer: Self-pay | Admitting: Unknown Physician Specialty

## 2017-01-15 ENCOUNTER — Ambulatory Visit: Payer: Commercial Managed Care - PPO | Admitting: Unknown Physician Specialty

## 2017-01-27 ENCOUNTER — Other Ambulatory Visit: Payer: Self-pay | Admitting: Unknown Physician Specialty

## 2017-03-21 ENCOUNTER — Other Ambulatory Visit: Payer: Self-pay | Admitting: Family Medicine

## 2017-03-22 NOTE — Telephone Encounter (Signed)
Your patient 

## 2017-04-23 ENCOUNTER — Ambulatory Visit: Payer: Commercial Managed Care - PPO | Admitting: Unknown Physician Specialty

## 2017-04-26 ENCOUNTER — Encounter: Payer: Self-pay | Admitting: Unknown Physician Specialty

## 2017-04-26 ENCOUNTER — Ambulatory Visit (INDEPENDENT_AMBULATORY_CARE_PROVIDER_SITE_OTHER): Payer: Commercial Managed Care - PPO | Admitting: Unknown Physician Specialty

## 2017-04-26 VITALS — BP 136/81 | HR 80 | Temp 98.1°F | Ht 61.2 in | Wt 280.7 lb

## 2017-04-26 DIAGNOSIS — E782 Mixed hyperlipidemia: Secondary | ICD-10-CM

## 2017-04-26 DIAGNOSIS — E1122 Type 2 diabetes mellitus with diabetic chronic kidney disease: Secondary | ICD-10-CM | POA: Diagnosis not present

## 2017-04-26 DIAGNOSIS — I10 Essential (primary) hypertension: Secondary | ICD-10-CM

## 2017-04-26 LAB — BAYER DCA HB A1C WAIVED: HB A1C: 6.9 % (ref <7.0)

## 2017-04-26 NOTE — Progress Notes (Signed)
BP 136/81   Pulse 80   Temp 98.1 F (36.7 C)   Ht 5' 1.2" (1.554 m)   Wt 280 lb 11.2 oz (127.3 kg)   LMP 02/26/2017 (Exact Date)   SpO2 98%   BMI 52.69 kg/m    Subjective:    Patient ID: Eileen Ritter, female    DOB: 1966-10-01, 51 y.o.   MRN: [id number]  HPI: Eileen Ritter is a 51 y.o. female  Chief Complaint  Patient presents with  . Depression  . Diabetes  . Hyperlipidemia  . Hypertension   Diabetes: Using medications without difficulties.  States she got off track for about 1 months No hypoglycemic episodes No hyperglycemic episodes Feet problems: none Blood Sugars averaging: 130 in the morning but after eating can go up to 211.  Finds this is particularly with evening meal eye exam within last year Last Hgb A1C: 6.9  Hypertension  Using medications without difficulty Average home BPs   Using medication without problems or lightheadedness No chest pain with exertion or shortness of breath No Edema  Elevated Cholesterol Using medications without problems No Muscle aches  Diet: States she eats the same as always.  States her breakfast is the worse in which she eats a egg biscuit.   Exercise: None other than at work.    Depression States "I am doing great" Depression screen [facility] 2/9 04/26/2017 10/26/2016 09/11/2015  Decreased Interest 0 0 1  Down, Depressed, Hopeless 0 0 0  PHQ - 2 Score 0 0 1  Altered sleeping - 0 -  Tired, decreased energy - 0 -  Change in appetite - 0 -  Feeling bad or failure about yourself  - 0 -  Trouble concentrating - 0 -  Moving slowly or fidgety/restless - 0 -  Suicidal thoughts - 0 -  PHQ-9 Score - 0 -   Relevant past medical, surgical, family and social history reviewed and updated as indicated. Interim medical history since our last visit reviewed. Allergies and medications reviewed and updated.  Review of Systems  Per HPI unless specifically indicated above     Objective:    BP 136/81   Pulse 80   Temp 98.1 F  (36.7 C)   Ht 5' 1.2" (1.554 m)   Wt 280 lb 11.2 oz (127.3 kg)   LMP 02/26/2017 (Exact Date)   SpO2 98%   BMI 52.69 kg/m   Wt Readings from Last 3 Encounters:  04/26/17 280 lb 11.2 oz (127.3 kg)  12/18/16 274 lb 12.8 oz (124.6 kg)  10/26/16 271 lb (122.9 kg)    Physical Exam  Constitutional: She is oriented to person, place, and time. She appears well-developed and well-nourished. No distress.  HENT:  Head: Normocephalic and atraumatic.  Eyes: Conjunctivae and lids are normal. Right eye exhibits no discharge. Left eye exhibits no discharge. No scleral icterus.  Neck: Normal range of motion. Neck supple. No JVD present. Carotid bruit is not present.  Cardiovascular: Normal rate, regular rhythm and normal heart sounds.   Pulmonary/Chest: Effort normal and breath sounds normal.  Abdominal: Normal appearance. There is no splenomegaly or hepatomegaly.  Musculoskeletal: Normal range of motion.  Neurological: She is alert and oriented to person, place, and time.  Skin: Skin is warm, dry and intact. No rash noted. No pallor.  Psychiatric: She has a normal mood and affect. Her behavior is normal. Judgment and thought content normal.    Results for orders placed or performed in visit on 12/18/16  Prolactin  Result Value Ref Range   Prolactin 9.1 4.8 - 23.3 ng/mL  Thyroid Panel With TSH  Result Value Ref Range   TSH 3.090 0.450 - 4.500 uIU/mL   T4, Total 7.0 4.5 - 12.0 ug/dL   T3 Uptake Ratio 26 24 - 39 %   Free Thyroxine Index 1.8 1.2 - 4.9  FSH  Result Value Ref Range   FSH 19.1 mIU/mL  LH  Result Value Ref Range   LH 9.6 mIU/mL      Assessment & Plan:   Problem List Items Addressed This Visit      Unprioritized   HTN (hypertension)    Stable, continue present medications.        Hyperlipidemia    Will check next visit. 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  Stable.  Continue present medications.  Refer  to lifestyle center      Relevant Orders   Comprehensive metabolic panel   Bayer DCA Hb A1c Waived   Amb Referral to Nutrition and Diabetic E       Follow up plan: Return for physical 3-4 months.

## 2017-04-26 NOTE — Assessment & Plan Note (Addendum)
Hgb A1C 6.9.  Stable.  Continue present medications.  Refer to lifestyle center

## 2017-04-26 NOTE — Assessment & Plan Note (Signed)
Stable, continue present medications.   

## 2017-04-26 NOTE — Assessment & Plan Note (Signed)
Will check next visit. On Lipitor

## 2017-04-27 LAB — COMPREHENSIVE METABOLIC PANEL
ALBUMIN: 4.2 g/dL (ref 3.5–5.5)
ALK PHOS: 85 IU/L (ref 39–117)
ALT: 16 IU/L (ref 0–32)
AST: 13 IU/L (ref 0–40)
Albumin/Globulin Ratio: 1.8 (ref 1.2–2.2)
BUN / CREAT RATIO: 23 (ref 9–23)
BUN: 17 mg/dL (ref 6–24)
CHLORIDE: 100 mmol/L (ref 96–106)
CO2: 21 mmol/L (ref 18–29)
Calcium: 9.1 mg/dL (ref 8.7–10.2)
Creatinine, Ser: 0.74 mg/dL (ref 0.57–1.00)
GFR calc non Af Amer: 95 mL/min/{1.73_m2} (ref >59)
GFR, EST AFRICAN AMERICAN: 110 mL/min/{1.73_m2} (ref >59)
GLOBULIN, TOTAL: 2.4 g/dL (ref 1.5–4.5)
Glucose: 139 mg/dL — ABNORMAL HIGH (ref 65–99)
Potassium: 4.5 mmol/L (ref 3.5–5.2)
Sodium: 140 mmol/L (ref 134–144)
Total Protein: 6.6 g/dL (ref 6.0–8.5)

## 2017-06-12 ENCOUNTER — Other Ambulatory Visit: Payer: Self-pay | Admitting: Unknown Physician Specialty

## 2017-06-15 ENCOUNTER — Other Ambulatory Visit: Payer: Self-pay | Admitting: Unknown Physician Specialty

## 2017-06-17 ENCOUNTER — Other Ambulatory Visit: Payer: Self-pay | Admitting: Unknown Physician Specialty

## 2017-07-19 ENCOUNTER — Other Ambulatory Visit: Payer: Self-pay | Admitting: Unknown Physician Specialty

## 2017-07-20 ENCOUNTER — Other Ambulatory Visit: Payer: Self-pay | Admitting: Unknown Physician Specialty

## 2017-07-20 ENCOUNTER — Ambulatory Visit: Payer: Commercial Managed Care - PPO | Admitting: Unknown Physician Specialty

## 2017-07-27 ENCOUNTER — Ambulatory Visit (INDEPENDENT_AMBULATORY_CARE_PROVIDER_SITE_OTHER): Payer: Commercial Managed Care - PPO | Admitting: Unknown Physician Specialty

## 2017-07-27 ENCOUNTER — Encounter: Payer: Self-pay | Admitting: Unknown Physician Specialty

## 2017-07-27 VITALS — BP 133/82 | HR 76 | Temp 98.1°F | Ht 61.1 in | Wt 277.3 lb

## 2017-07-27 DIAGNOSIS — I10 Essential (primary) hypertension: Secondary | ICD-10-CM | POA: Diagnosis not present

## 2017-07-27 DIAGNOSIS — F325 Major depressive disorder, single episode, in full remission: Secondary | ICD-10-CM

## 2017-07-27 DIAGNOSIS — E782 Mixed hyperlipidemia: Secondary | ICD-10-CM | POA: Diagnosis not present

## 2017-07-27 DIAGNOSIS — E1122 Type 2 diabetes mellitus with diabetic chronic kidney disease: Secondary | ICD-10-CM

## 2017-07-27 DIAGNOSIS — Z0000 Encounter for general adult medical examination without abnormal findings: Secondary | ICD-10-CM

## 2017-07-27 DIAGNOSIS — Z6841 Body Mass Index (BMI) 40.0 and over, adult: Secondary | ICD-10-CM

## 2017-07-27 DIAGNOSIS — E119 Type 2 diabetes mellitus without complications: Secondary | ICD-10-CM

## 2017-07-27 LAB — BAYER DCA HB A1C WAIVED: HB A1C: 7.2 % — AB (ref <7.0)

## 2017-07-27 NOTE — Assessment & Plan Note (Signed)
Lost 3 pounds.  Continue to work on diet.  Discussed testing for sleep apnea

## 2017-07-27 NOTE — Assessment & Plan Note (Signed)
Check lipid panel  

## 2017-07-27 NOTE — Progress Notes (Signed)
BP 133/82   Pulse 76   Temp 98.1 F (36.7 C)   Ht 5' 1.1" (1.552 m)   Wt 277 lb 4.8 oz (125.8 kg)   LMP 07/13/2017 (Exact Date)   SpO2 97%   BMI 52.22 kg/m    Subjective:    Patient ID: Eileen Ritter, female    DOB: 1966-03-25, 51 y.o.   MRN: [id number]  HPI: Eileen Ritter is a 51 y.o. female  Chief Complaint  Patient presents with  . Annual Exam  . Diabetes  . Hyperlipidemia  . Hypertension   Diabetes: Using medications without difficulties No hypoglycemic episodes No hyperglycemic episodes Feet problems: none Blood Sugars averaging: checks off and on and normally below 150 eye exam within last year Last Hgb A1C: 6.9  Hypertension  Using medications without difficulty Average home BPs   Using medication without problems or lightheadedness No chest pain with exertion or shortness of breath No Edema  Elevated Cholesterol Using medications without problems No Muscle aches  Diet: Exercise: Being mindful and doing the best she can.    Depression screen [facility] 2/9 07/27/2017 04/26/2017 10/26/2016 09/11/2015  Decreased Interest 0 0 0 1  Down, Depressed, Hopeless 0 0 0 0  PHQ - 2 Score 0 0 0 1  Altered sleeping 0 - 0 -  Tired, decreased energy 0 - 0 -  Change in appetite 0 - 0 -  Feeling bad or failure about yourself  0 - 0 -  Trouble concentrating 0 - 0 -  Moving slowly or fidgety/restless 0 - 0 -  Suicidal thoughts 0 - 0 -  PHQ-9 Score 0 - 0 -   Family History  Problem Relation Age of Onset  . Diabetes Mother   . Heart disease Mother   . Hypertension Mother   . Stroke Mother   . Cancer Mother        LUNG  . Mental illness Mother   . Thyroid disease Mother   . Diabetes Father   . Heart disease Father   . Diabetes Sister   . Diabetes Brother   . Heart disease Brother   . Heart disease Maternal Grandmother   . Breast cancer Neg Hx    Social History   Social History  . Marital status: Divorced    Spouse name: N/A  . Number of children: N/A  .  Years of education: N/A   Occupational History  . Not on file.   Social History Main Topics  . Smoking status: Former Smoker    Quit date: 11/01/2013  . Smokeless tobacco: Never Used  . Alcohol use No  . Drug use: No  . Sexual activity: Yes   Other Topics Concern  . Not on file   Social History Narrative  . No narrative on file    Past Medical History:  Diagnosis Date  . Allergic rhinitis   . Anxiety   . Chronic kidney disease   . Depression   . Diabetes ([place])   . GERD (gastroesophageal reflux disease)   . HBP (high blood pressure)   . High cholesterol   . Obesity    Past Surgical History:  Procedure Laterality Date  . APPENDECTOMY    . CYST EXCISION     back  . TONSILLECTOMY    . TUBAL LIGATION      . Relevant past medical, surgical, family and social history reviewed and updated as indicated. Interim medical history since our last visit reviewed. Allergies and medications  reviewed and updated.  Review of Systems  Per HPI unless specifically indicated above     Objective:    BP 133/82   Pulse 76   Temp 98.1 F (36.7 C)   Ht 5' 1.1" (1.552 m)   Wt 277 lb 4.8 oz (125.8 kg)   LMP 07/13/2017 (Exact Date)   SpO2 97%   BMI 52.22 kg/m   Wt Readings from Last 3 Encounters:  07/27/17 277 lb 4.8 oz (125.8 kg)  04/26/17 280 lb 11.2 oz (127.3 kg)  12/18/16 274 lb 12.8 oz (124.6 kg)    Physical Exam  Constitutional: She is oriented to person, place, and time. She appears well-developed and well-nourished.  HENT:  Head: Normocephalic and atraumatic.  Eyes: Pupils are equal, round, and reactive to light. Right eye exhibits no discharge. Left eye exhibits no discharge. No scleral icterus.  Neck: Normal range of motion. Neck supple. Carotid bruit is not present. No thyromegaly present.  Cardiovascular: Normal rate, regular rhythm and normal heart sounds.  Exam reveals no gallop and no friction rub.   No murmur heard. Pulmonary/Chest: Effort normal and breath  sounds normal. No respiratory distress. She has no wheezes. She has no rales.  Abdominal: Soft. Bowel sounds are normal. There is no tenderness. There is no rebound.  Genitourinary: No breast swelling, tenderness or discharge.  Musculoskeletal: Normal range of motion.  Lymphadenopathy:    She has no cervical adenopathy.  Neurological: She is alert and oriented to person, place, and time.  Skin: Skin is warm, dry and intact. No rash noted.  Psychiatric: She has a normal mood and affect. Her speech is normal and behavior is normal. Judgment and thought content normal. Cognition and memory are normal.    Results for orders placed or performed in visit on 04/26/17  Comprehensive metabolic panel  Result Value Ref Range   Glucose 139 (H) 65 - 99 mg/dL   BUN 17 6 - 24 mg/dL   Creatinine, Ser 0.74 0.57 - 1.00 mg/dL   GFR calc non Af Amer 95 >59 mL/min/1.73   GFR calc Af Amer 110 >59 mL/min/1.73   BUN/Creatinine Ratio 23 9 - 23   Sodium 140 134 - 144 mmol/L   Potassium 4.5 3.5 - 5.2 mmol/L   Chloride 100 96 - 106 mmol/L   CO2 21 18 - 29 mmol/L   Calcium 9.1 8.7 - 10.2 mg/dL   Total Protein 6.6 6.0 - 8.5 g/dL   Albumin 4.2 3.5 - 5.5 g/dL   Globulin, Total 2.4 1.5 - 4.5 g/dL   Albumin/Globulin Ratio 1.8 1.2 - 2.2   Bilirubin Total <0.2 0.0 - 1.2 mg/dL   Alkaline Phosphatase 85 39 - 117 IU/L   AST 13 0 - 40 IU/L   ALT 16 0 - 32 IU/L  Bayer DCA Hb A1c Waived  Result Value Ref Range   Bayer DCA Hb A1c Waived 6.9 <7.0 %      Assessment & Plan:   Problem List Items Addressed This Visit      Unprioritized   Depression    Complete remission.  Continue present meds      Diabetes ([place]) - Primary   Relevant Orders   Comprehensive metabolic panel   Bayer DCA Hb A1c Waived   HTN (hypertension)    Stable, continue present medications.        Relevant Orders   Comprehensive metabolic panel   Hyperlipidemia    Check lipid panel      Relevant Orders  Lipid Panel w/o Chol/HDL  Ratio   Obesity    Lost 3 pounds.  Continue to work on diet.  Discussed testing for sleep apnea      Type 2 diabetes mellitus with chronic kidney disease ([facility])    Hgb A1C was 7.2% Not to goal.  Work on diet and exercise.         Other Visit Diagnoses    Routine general medical examination at a health care facility       Relevant Orders   CBC with Differential/Platelet   VITAMIN D 25 Hydroxy (Vit-D Deficiency, Fractures)   TSH   MM DIGITAL SCREENING BILATERAL       Follow up plan: Return in about 3 months (around 10/27/2017).

## 2017-07-27 NOTE — Assessment & Plan Note (Signed)
Hgb A1C was 7.2% Not to goal.  Work on diet and exercise.

## 2017-07-27 NOTE — Assessment & Plan Note (Signed)
Complete remission.  Continue present meds

## 2017-07-27 NOTE — Assessment & Plan Note (Signed)
Stable, continue present medications.   

## 2017-07-27 NOTE — Patient Instructions (Addendum)
Preventive Care 40-64 Years, Female Preventive care refers to lifestyle choices and visits with your health care provider that can promote health and wellness. What does preventive care include?  A yearly physical exam. This is also called an annual well check.  Dental exams once or twice a year.  Routine eye exams. Ask your health care provider how often you should have your eyes checked.  Personal lifestyle choices, including: ? Daily care of your teeth and gums. ? Regular physical activity. ? Eating a healthy diet. ? Avoiding tobacco and drug use. ? Limiting alcohol use. ? Practicing safe sex. ? Taking low-dose aspirin daily starting at age 58. ? Taking vitamin and mineral supplements as recommended by your health care provider. What happens during an annual well check? The services and screenings done by your health care provider during your annual well check will depend on your age, overall health, lifestyle risk factors, and family history of disease. Counseling Your health care provider may ask you questions about your:  Alcohol use.  Tobacco use.  Drug use.  Emotional well-being.  Home and relationship well-being.  Sexual activity.  Eating habits.  Work and work Statistician.  Method of birth control.  Menstrual cycle.  Pregnancy history.  Screening You may have the following tests or measurements:  Height, weight, and BMI.  Blood pressure.  Lipid and cholesterol levels. These may be checked every 5 years, or more frequently if you are over 81 years old.  Skin check.  Lung cancer screening. You may have this screening every year starting at age 78 if you have a 30-pack-year history of smoking and currently smoke or have quit within the past 15 years.  Fecal occult blood test (FOBT) of the stool. You may have this test every year starting at age 65.  Flexible sigmoidoscopy or colonoscopy. You may have a sigmoidoscopy every 5 years or a colonoscopy  every 10 years starting at age 30.  Hepatitis C blood test.  Hepatitis B blood test.  Sexually transmitted disease (STD) testing.  Diabetes screening. This is done by checking your blood sugar (glucose) after you have not eaten for a while (fasting). You may have this done every 1-3 years.  Mammogram. This may be done every 1-2 years. Talk to your health care provider about when you should start having regular mammograms. This may depend on whether you have a family history of breast cancer.  BRCA-related cancer screening. This may be done if you have a family history of breast, ovarian, tubal, or peritoneal cancers.  Pelvic exam and Pap test. This may be done every 3 years starting at age 80. Starting at age 36, this may be done every 5 years if you have a Pap test in combination with an HPV test.  Bone density scan. This is done to screen for osteoporosis. You may have this scan if you are at high risk for osteoporosis.  Discuss your test results, treatment options, and if necessary, the need for more tests with your health care provider. Vaccines Your health care provider may recommend certain vaccines, such as:  Influenza vaccine. This is recommended every year.  Tetanus, diphtheria, and acellular pertussis (Tdap, Td) vaccine. You may need a Td booster every 10 years.  Varicella vaccine. You may need this if you have not been vaccinated.  Zoster vaccine. You may need this after age 5.  Measles, mumps, and rubella (MMR) vaccine. You may need at least one dose of MMR if you were born in  1955 or later. You may also need a second dose.  Pneumococcal 13-valent conjugate (PCV13) vaccine. You may need this if you have certain conditions and were not previously vaccinated.  Pneumococcal polysaccharide (PPSV23) vaccine. You may need one or two doses if you smoke cigarettes or if you have certain conditions.  Meningococcal vaccine. You may need this if you have certain  conditions.  Hepatitis A vaccine. You may need this if you have certain conditions or if you travel or work in places where you may be exposed to hepatitis A.  Hepatitis B vaccine. You may need this if you have certain conditions or if you travel or work in places where you may be exposed to hepatitis B.  Haemophilus influenzae type b (Hib) vaccine. You may need this if you have certain conditions.  Talk to your health care provider about which screenings and vaccines you need and how often you need them. This information is not intended to replace advice given to you by your health care provider. Make sure you discuss any questions you have with your health care provider. Document Released: 12/27/2015 Document Revised: 08/19/2016 Document Reviewed: 10/01/2015 Elsevier Interactive Patient Education  2017 [employer].

## 2017-07-28 ENCOUNTER — Telehealth: Payer: Self-pay | Admitting: Unknown Physician Specialty

## 2017-07-28 DIAGNOSIS — D500 Iron deficiency anemia secondary to blood loss (chronic): Secondary | ICD-10-CM

## 2017-07-28 DIAGNOSIS — D509 Iron deficiency anemia, unspecified: Secondary | ICD-10-CM | POA: Insufficient documentation

## 2017-07-28 LAB — CBC WITH DIFFERENTIAL/PLATELET
BASOS ABS: 0.1 10*3/uL (ref 0.0–0.2)
Basos: 1 %
EOS (ABSOLUTE): 0.2 10*3/uL (ref 0.0–0.4)
Eos: 2 %
HEMOGLOBIN: 11 g/dL — AB (ref 11.1–15.9)
Hematocrit: 36.3 % (ref 34.0–46.6)
IMMATURE GRANS (ABS): 0 10*3/uL (ref 0.0–0.1)
IMMATURE GRANULOCYTES: 0 %
LYMPHS: 29 %
Lymphocytes Absolute: 3.2 10*3/uL — ABNORMAL HIGH (ref 0.7–3.1)
MCH: 19.9 pg — ABNORMAL LOW (ref 26.6–33.0)
MCHC: 30.3 g/dL — ABNORMAL LOW (ref 31.5–35.7)
MCV: 66 fL — ABNORMAL LOW (ref 79–97)
MONOCYTES: 10 %
Monocytes Absolute: 1.1 10*3/uL — ABNORMAL HIGH (ref 0.1–0.9)
NEUTROS ABS: 6.5 10*3/uL (ref 1.4–7.0)
NEUTROS PCT: 58 %
PLATELETS: 468 10*3/uL — AB (ref 150–379)
RBC: 5.53 x10E6/uL — AB (ref 3.77–5.28)
RDW: 18.6 % — ABNORMAL HIGH (ref 12.3–15.4)
WBC: 11 10*3/uL — ABNORMAL HIGH (ref 3.4–10.8)

## 2017-07-28 LAB — LIPID PANEL W/O CHOL/HDL RATIO
Cholesterol, Total: 140 mg/dL (ref 100–199)
HDL: 44 mg/dL (ref >39)
LDL CALC: 79 mg/dL (ref 0–99)
Triglycerides: 85 mg/dL (ref 0–149)
VLDL CHOLESTEROL CAL: 17 mg/dL (ref 5–40)

## 2017-07-28 LAB — TSH: TSH: 4.47 u[IU]/mL (ref 0.450–4.500)

## 2017-07-28 LAB — COMPREHENSIVE METABOLIC PANEL
ALBUMIN: 4.3 g/dL (ref 3.5–5.5)
ALT: 30 IU/L (ref 0–32)
AST: 23 IU/L (ref 0–40)
Albumin/Globulin Ratio: 1.9 (ref 1.2–2.2)
Alkaline Phosphatase: 78 IU/L (ref 39–117)
BUN / CREAT RATIO: 20 (ref 9–23)
BUN: 13 mg/dL (ref 6–24)
Bilirubin Total: 0.3 mg/dL (ref 0.0–1.2)
CALCIUM: 9.6 mg/dL (ref 8.7–10.2)
CO2: 22 mmol/L (ref 20–29)
CREATININE: 0.65 mg/dL (ref 0.57–1.00)
Chloride: 101 mmol/L (ref 96–106)
GFR, EST AFRICAN AMERICAN: 121 mL/min/{1.73_m2} (ref >59)
GFR, EST NON AFRICAN AMERICAN: 105 mL/min/{1.73_m2} (ref >59)
GLUCOSE: 145 mg/dL — AB (ref 65–99)
Globulin, Total: 2.3 g/dL (ref 1.5–4.5)
Potassium: 4.7 mmol/L (ref 3.5–5.2)
Sodium: 139 mmol/L (ref 134–144)
TOTAL PROTEIN: 6.6 g/dL (ref 6.0–8.5)

## 2017-07-28 LAB — VITAMIN D 25 HYDROXY (VIT D DEFICIENCY, FRACTURES): Vit D, 25-Hydroxy: 20.2 ng/mL — ABNORMAL LOW (ref 30.0–100.0)

## 2017-07-28 NOTE — Telephone Encounter (Signed)
Discussed labs.  Mild anemia.  Heavy menses.  Take OTC iron.  Check in one month and consider referral to gynecologist.   Start Vit D

## 2017-08-02 ENCOUNTER — Other Ambulatory Visit: Payer: Self-pay | Admitting: Unknown Physician Specialty

## 2017-08-03 ENCOUNTER — Encounter: Payer: Self-pay | Admitting: Unknown Physician Specialty

## 2017-08-03 MED ORDER — FLUCONAZOLE 150 MG PO TABS: 150.0000 mg | ORAL_TABLET | Freq: Once | ORAL | 0 refills | Status: AC

## 2017-08-05 ENCOUNTER — Encounter: Payer: Self-pay | Admitting: Unknown Physician Specialty

## 2017-08-06 MED ORDER — FLUCONAZOLE 150 MG PO TABS: 150.0000 mg | ORAL_TABLET | Freq: Once | ORAL | 0 refills | Status: AC

## 2017-08-12 ENCOUNTER — Other Ambulatory Visit: Payer: Self-pay | Admitting: Unknown Physician Specialty

## 2017-08-23 ENCOUNTER — Ambulatory Visit (INDEPENDENT_AMBULATORY_CARE_PROVIDER_SITE_OTHER): Payer: Commercial Managed Care - PPO | Admitting: Unknown Physician Specialty

## 2017-08-23 ENCOUNTER — Encounter: Payer: Self-pay | Admitting: Unknown Physician Specialty

## 2017-08-23 VITALS — BP 131/78 | HR 94 | Temp 98.4°F | Wt 283.4 lb

## 2017-08-23 DIAGNOSIS — D500 Iron deficiency anemia secondary to blood loss (chronic): Secondary | ICD-10-CM

## 2017-08-23 NOTE — Progress Notes (Signed)
BP 131/78   Pulse 94   Temp 98.4 F (36.9 C)   Wt 283 lb 6.4 oz (128.5 kg)   SpO2 98%   BMI 53.37 kg/m    Subjective:    Patient ID: Eileen Ritter, female    DOB: [birth year], 50 y.o.   MRN: [id number]  HPI: Eileen Ritter is a 51 y.o. female  Chief Complaint  Patient presents with  . Anemia    1 month f/up   F/u of anemia Mild anemia last visit.  She is feeling better.  She has heavy menses that last about 5 days.  She is now taking iron daily.    Relevant past medical, surgical, family and social history reviewed and updated as indicated. Interim medical history since our last visit reviewed. Allergies and medications reviewed and updated.  Review of Systems  Per HPI unless specifically indicated above     Objective:    BP 131/78   Pulse 94   Temp 98.4 F (36.9 C)   Wt 283 lb 6.4 oz (128.5 kg)   SpO2 98%   BMI 53.37 kg/m   Wt Readings from Last 3 Encounters:  08/23/17 283 lb 6.4 oz (128.5 kg)  07/27/17 277 lb 4.8 oz (125.8 kg)  04/26/17 280 lb 11.2 oz (127.3 kg)    Physical Exam  Constitutional: She is oriented to person, place, and time. She appears well-developed and well-nourished. No distress.  HENT:  Head: Normocephalic and atraumatic.  Eyes: Conjunctivae and lids are normal. Right eye exhibits no discharge. Left eye exhibits no discharge. No scleral icterus.  Cardiovascular: Normal rate.   Pulmonary/Chest: Effort normal.  Abdominal: Normal appearance. There is no splenomegaly or hepatomegaly.  Musculoskeletal: Normal range of motion.  Neurological: She is alert and oriented to person, place, and time.  Skin: Skin is intact. No rash noted. No pallor.  Psychiatric: She has a normal mood and affect. Her behavior is normal. Judgment and thought content normal.    Results for orders placed or performed in visit on 07/27/17  CBC with Differential/Platelet  Result Value Ref Range   WBC 11.0 (H) 3.4 - 10.8 x10E3/uL   RBC 5.53 (H) 3.77 - 5.28  x10E6/uL   Hemoglobin 11.0 (L) 11.1 - 15.9 g/dL   Hematocrit 36.3 34.0 - 46.6 %   MCV 66 (L) 79 - 97 fL   MCH 19.9 (L) 26.6 - 33.0 pg   MCHC 30.3 (L) 31.5 - 35.7 g/dL   RDW 18.6 (H) 12.3 - 15.4 %   Platelets 468 (H) 150 - 379 x10E3/uL   Neutrophils 58 Not Estab. %   Lymphs 29 Not Estab. %   Monocytes 10 Not Estab. %   Eos 2 Not Estab. %   Basos 1 Not Estab. %   Neutrophils Absolute 6.5 1.4 - 7.0 x10E3/uL   Lymphocytes Absolute 3.2 (H) 0.7 - 3.1 x10E3/uL   Monocytes Absolute 1.1 (H) 0.1 - 0.9 x10E3/uL   EOS (ABSOLUTE) 0.2 0.0 - 0.4 x10E3/uL   Basophils Absolute 0.1 0.0 - 0.2 x10E3/uL   Immature Granulocytes 0 Not Estab. %   Immature Grans (Abs) 0.0 0.0 - 0.1 x10E3/uL  Comprehensive metabolic panel  Result Value Ref Range   Glucose 145 (H) 65 - 99 mg/dL   BUN 13 6 - 24 mg/dL   Creatinine, Ser 0.65 0.57 - 1.00 mg/dL   GFR calc non Af Amer 105 >59 mL/min/1.73   GFR calc Af Amer 121 >59 mL/min/1.73   BUN/Creatinine Ratio  20 9 - 23   Sodium 139 134 - 144 mmol/L   Potassium 4.7 3.5 - 5.2 mmol/L   Chloride 101 96 - 106 mmol/L   CO2 22 20 - 29 mmol/L   Calcium 9.6 8.7 - 10.2 mg/dL   Total Protein 6.6 6.0 - 8.5 g/dL   Albumin 4.3 3.5 - 5.5 g/dL   Globulin, Total 2.3 1.5 - 4.5 g/dL   Albumin/Globulin Ratio 1.9 1.2 - 2.2   Bilirubin Total 0.3 0.0 - 1.2 mg/dL   Alkaline Phosphatase 78 39 - 117 IU/L   AST 23 0 - 40 IU/L   ALT 30 0 - 32 IU/L  Lipid Panel w/o Chol/HDL Ratio  Result Value Ref Range   Cholesterol, Total 140 100 - 199 mg/dL   Triglycerides 85 0 - 149 mg/dL   HDL 44 >39 mg/dL   VLDL Cholesterol Cal 17 5 - 40 mg/dL   LDL Calculated 79 0 - 99 mg/dL  VITAMIN D 25 Hydroxy (Vit-D Deficiency, Fractures)  Result Value Ref Range   Vit D, 25-Hydroxy 20.2 (L) 30.0 - 100.0 ng/mL  TSH  Result Value Ref Range   TSH 4.470 0.450 - 4.500 uIU/mL  Bayer DCA Hb A1c Waived  Result Value Ref Range   Bayer DCA Hb A1c Waived 7.2 (H) <7.0 %   Today's H/H 11.2/37.0+++++++++--      Assessment & Plan:   Problem List Items Addressed This Visit      Unprioritized   Iron deficiency anemia - Primary    This is mildly better.  Also refer to GI as also needs a screening colonoscopy      Relevant Medications   Ferrous Sulfate Dried (SLOW RELEASE IRON) 45 MG TBCR   Other Relevant Orders   CBC With Differential/Platelet   Ambulatory referral to Gastroenterology       Follow up plan: Return in about 4 weeks (around 09/20/2017), or if symptoms worsen or fail to improve.

## 2017-08-23 NOTE — Assessment & Plan Note (Signed)
This is mildly better.  Also refer to GI as also needs a screening colonoscopy

## 2017-08-24 LAB — CBC WITH DIFFERENTIAL/PLATELET
HEMATOCRIT: 37 % (ref 34.0–46.6)
Hemoglobin: 11.2 g/dL (ref 11.1–15.9)
Lymphocytes Absolute: 4.4 10*3/uL — ABNORMAL HIGH (ref 0.7–3.1)
Lymphs: 33 %
MCH: 20.8 pg — AB (ref 26.6–33.0)
MCHC: 30.3 g/dL — AB (ref 31.5–35.7)
MCV: 69 fL — AB (ref 79–97)
MID (Absolute): 1.3 10*3/uL (ref 0.1–1.6)
MID: 10 %
NEUTROS PCT: 56 %
Neutrophils Absolute: 7.3 10*3/uL — ABNORMAL HIGH (ref 1.4–7.0)
Platelets: 423 10*3/uL — ABNORMAL HIGH (ref 150–379)
RBC: 5.39 x10E6/uL — ABNORMAL HIGH (ref 3.77–5.28)
RDW: 21.3 % — ABNORMAL HIGH (ref 12.3–15.4)
WBC: 13 10*3/uL — AB (ref 3.4–10.8)

## 2017-08-25 ENCOUNTER — Encounter: Payer: Self-pay | Admitting: Unknown Physician Specialty

## 2017-08-25 MED ORDER — FLUCONAZOLE 150 MG PO TABS: 150.0000 mg | ORAL_TABLET | Freq: Once | ORAL | 0 refills | Status: DC

## 2017-08-25 MED ORDER — FLUCONAZOLE 150 MG PO TABS: 150.0000 mg | ORAL_TABLET | Freq: Once | ORAL | 0 refills | Status: AC

## 2017-09-09 ENCOUNTER — Other Ambulatory Visit: Payer: Self-pay | Admitting: Family Medicine

## 2017-09-15 ENCOUNTER — Ambulatory Visit (INDEPENDENT_AMBULATORY_CARE_PROVIDER_SITE_OTHER): Payer: Commercial Managed Care - PPO | Admitting: Gastroenterology

## 2017-09-15 ENCOUNTER — Encounter: Payer: Self-pay | Admitting: Gastroenterology

## 2017-09-15 ENCOUNTER — Other Ambulatory Visit
Admission: RE | Admit: 2017-09-15 | Discharge: 2017-09-15 | Disposition: A | Discharge status: Home / Self Care | Payer: Commercial Managed Care - PPO | Source: Ambulatory Visit | Attending: Gastroenterology | Admitting: Gastroenterology

## 2017-09-15 VITALS — BP 136/80 | HR 83 | Temp 97.6°F | Ht 61.1 in | Wt 283.6 lb

## 2017-09-15 DIAGNOSIS — D509 Iron deficiency anemia, unspecified: Secondary | ICD-10-CM | POA: Diagnosis present

## 2017-09-15 LAB — FERRITIN: Ferritin: 6 ng/mL — ABNORMAL LOW (ref 11–307)

## 2017-09-15 LAB — URINALYSIS, COMPLETE (UACMP) WITH MICROSCOPIC
BACTERIA UA: NONE SEEN
BILIRUBIN URINE: NEGATIVE
Glucose, UA: >=500 mg/dL — AB
Hgb urine dipstick: NEGATIVE
KETONES UR: NEGATIVE mg/dL
LEUKOCYTES UA: NEGATIVE
NITRITE: NEGATIVE
PH: 6 (ref 5.0–8.0)
Protein, ur: NEGATIVE mg/dL
SPECIFIC GRAVITY, URINE: 1.021 (ref 1.005–1.030)

## 2017-09-15 LAB — FOLATE: FOLATE: 16.3 ng/mL (ref >5.9)

## 2017-09-15 LAB — VITAMIN B12: VITAMIN B 12: 190 pg/mL (ref 180–914)

## 2017-09-15 LAB — IRON AND TIBC
IRON: 18 ug/dL — AB (ref 28–170)
SATURATION RATIOS: 4 % — AB (ref 10.4–31.8)
TIBC: 419 ug/dL (ref 250–450)
UIBC: 401 ug/dL

## 2017-09-15 NOTE — Progress Notes (Signed)
Jonathon Bellows MD, MRCP(U.K) [address]  [place], [place] [address]  Main: [phone]  Fax: [phone]   Gastroenterology Consultation  Referring Provider:     Kathrine Haddock, NP Primary Care Physician:  Kathrine Haddock, NP Primary Gastroenterologist:  Dr. Jonathon Bellows  Reason for Consultation:     Anemia         HPI:   Eileen Ritter is a 51 y.o. y/o female referred for consultation & management  by Dr. Kathrine Haddock, NP.    She has been referred for iron deficiency anemia, Labs 07/2017 Hb 11 with MCV 66 liver function tests's were normal. No older CBC to compare with  .  Rectal bleeding: No  Nose bleeds: No  Vaginal bleeding : during periods, lasts 5 days , first two days changes 5-6 pads a day , not heavily soaked  Hematemesis or hemoptysis : no  Blood in urine : no   No change in bowel habits. Mother possibly  Had colon polyps. She used to " a lot of ibuprofen" every day for months, stopped last few months back.She used to take it for her knee. She did have abdominal discomfort at that time. Taken mobic as well in the past.    Past Medical History:  Diagnosis Date  . Allergic rhinitis   . Anxiety   . Chronic kidney disease   . Depression   . Diabetes ([place])   . GERD (gastroesophageal reflux disease)   . HBP (high blood pressure)   . High cholesterol   . Obesity     Past Surgical History:  Procedure Laterality Date  . APPENDECTOMY    . CYST EXCISION     back  . TONSILLECTOMY    . TUBAL LIGATION      Prior to Admission medications   Medication Sig Start Date End Date Taking? Authorizing Provider  atorvastatin (LIPITOR) 40 MG tablet TAKE 1 TABLET (40 MG TOTAL) BY MOUTH DAILY. 08/03/17   Kathrine Haddock, NP  buPROPion (WELLBUTRIN SR) 150 MG 12 hr tablet TAKE 1 TABLET (150 MG TOTAL) BY MOUTH DAILY. 06/17/17   Kathrine Haddock, NP  Cholecalciferol (VITAMIN D) 2000 units tablet Take 2,000 Units by mouth daily.    [provider]    cyclobenzaprine (FLEXERIL) 10 MG tablet Take 1 tablet (10 mg total) by mouth as needed for muscle spasms. 09/11/15   Kathrine Haddock, NP  Ferrous Sulfate Dried (SLOW RELEASE IRON) 45 MG TBCR Take 45 mg by mouth daily.    [provider]  FLUoxetine (PROZAC) 20 MG tablet TAKE 1 TABLET (20 MG TOTAL) BY MOUTH AT BEDTIME. 06/14/17   Crissman, Jeannette How, MD  fluticasone (FLONASE) 50 MCG/ACT nasal spray Place 2 sprays into both nostrils daily.    [provider]  ibuprofen (ADVIL,MOTRIN) 200 MG tablet Take 200 mg by mouth every 4 (four) hours as needed.    [provider]  INVOKANA 300 MG TABS tablet TAKE 1 TABLET (300 MG TOTAL) BY MOUTH DAILY BEFORE BREAKFAST. 08/12/17   Johnson, Megan P, DO  losartan (COZAAR) 50 MG tablet TAKE 1 TABLET BY MOUTH EVERY DAY 09/10/17   Kathrine Haddock, NP  meloxicam (MOBIC) 15 MG tablet Take 1 tablet (15 mg total) by mouth daily. 12/18/16   Kathrine Haddock, NP  metFORMIN (GLUCOPHAGE) 500 MG tablet TAKE 2 TABLETS (1,000 MG TOTAL) BY MOUTH 2 (TWO) TIMES DAILY WITH A MEAL. 07/19/17   Kathrine Haddock, NP  nitrofurantoin, macrocrystal-monohydrate, (MACROBID) 100 MG capsule  Take 100 mg by mouth 2 (two) times daily. 07/19/17   [provider]  omeprazole (PRILOSEC) 20 MG capsule TAKE 1 CAPSULE (20 MG TOTAL) BY MOUTH DAILY. 07/20/17   Kathrine Haddock, NP    Family History  Problem Relation Age of Onset  . Diabetes Mother   . Heart disease Mother   . Hypertension Mother   . Stroke Mother   . Cancer Mother        LUNG  . Mental illness Mother   . Thyroid disease Mother   . Diabetes Father   . Heart disease Father   . Diabetes Sister   . Diabetes Brother   . Heart disease Brother   . Heart disease Maternal Grandmother   . Breast cancer Neg Hx      Social History  Substance Use Topics  . Smoking status: Former Smoker    Quit date: 11/01/2013  . Smokeless tobacco: Never Used  . Alcohol use No    Allergies as of 09/15/2017  . (No Known Allergies)     Review of Systems:    All systems reviewed and negative except where noted in HPI.   Physical Exam:  There were no vitals taken for this visit. No LMP recorded. Psych:  Alert and cooperative. Normal mood and affect. General:   Alert,  Well-developed, well-nourished, pleasant and cooperative in NAD Head:  Normocephalic and atraumatic. Eyes:  Sclera clear, no icterus.   Conjunctiva pink. Ears:  Normal auditory acuity. Nose:  No deformity, discharge, or lesions. Mouth:  No deformity or lesions,oropharynx pink & moist. Neck:  Supple; no masses or thyromegaly. Lungs:  Respirations even and unlabored.  Clear throughout to auscultation.   No wheezes, crackles, or rhonchi. No acute distress. Heart:  Regular rate and rhythm; no murmurs, clicks, rubs, or gallops. Abdomen:  Normal bowel sounds.  No bruits.  Soft, non-tender and non-distended without masses, hepatosplenomegaly or hernias noted.  No guarding or rebound tenderness.    Neurologic:  Alert and oriented x3;  grossly normal neurologically. Skin:  Intact without significant lesions or rashes. No jaundice. Lymph Nodes:  No significant cervical adenopathy. Psych:  Alert and cooperative. Normal mood and affect.  Imaging Studies: No results found.  Assessment and Plan:   Eileen Ritter is a 51 y.o. y/o female has been referred for  iron deficiency anemia. I do note that she has microcytic anemia, No iron studies available to review. Very likely could be related to chronic NSAID use and blood loss.  Plan   1. Iron studies, b12,folate , ferritin, celiac serology , Urine analysis.  2. EGD+colonoscopy and if negative will need capsule study of the small bowel.if CBC stays low despite staying off NSAID's  Follow up in 8 weeks   Dr Jonathon Bellows MD,MRCP(U.K)

## 2017-09-15 NOTE — Addendum Note (Signed)
Addended by: Peggye Ley on: 09/15/2017 10:54 AM   Modules accepted: Orders, SmartSet

## 2017-09-16 ENCOUNTER — Telehealth: Payer: Self-pay

## 2017-09-16 NOTE — Telephone Encounter (Signed)
-----   Message from Jonathon Bellows, MD sent at 09/16/2017  8:55 AM EDT ----- 1. Iron very low- suggest to continue oral iron and recheck in 3 months if still low then will need IV iron  2. B12 is low- she will need replacement therapy with her doctor.

## 2017-09-16 NOTE — Telephone Encounter (Signed)
LVM for callback for results per Dr. Vicente Males.   1. Iron very low- suggest to continue oral iron and recheck in 3 months if still low then will need IV iron  2. B12 is low- she will need replacement therapy with her doctor.

## 2017-09-17 ENCOUNTER — Telehealth: Payer: Self-pay

## 2017-09-17 LAB — CELIAC DISEASE PANEL
ENDOMYSIAL ANTIBODY IGA: NEGATIVE
IgA: 175 mg/dL (ref 87–352)
Tissue Transglutaminase Ab, IgA: <2 U/mL (ref 0–3)

## 2017-09-17 NOTE — Telephone Encounter (Signed)
-----   Message from Jonathon Bellows, MD sent at 09/17/2017  8:12 AM EDT ----- Celiac serology negative

## 2017-09-17 NOTE — Telephone Encounter (Signed)
Attempted to contact patient to provide results per Dr. Vicente Males.   Unable to lvm.

## 2017-09-20 ENCOUNTER — Ambulatory Visit: Payer: Commercial Managed Care - PPO | Admitting: Unknown Physician Specialty

## 2017-09-22 ENCOUNTER — Encounter: Payer: Self-pay | Admitting: *Deleted

## 2017-09-23 ENCOUNTER — Encounter: Payer: Self-pay | Admitting: *Deleted

## 2017-09-23 ENCOUNTER — Encounter
Admission: RE | Disposition: A | Discharge status: Home / Self Care | Payer: Self-pay | Source: Ambulatory Visit | Attending: Gastroenterology

## 2017-09-23 ENCOUNTER — Ambulatory Visit: Payer: Commercial Managed Care - PPO | Admitting: Anesthesiology

## 2017-09-23 ENCOUNTER — Ambulatory Visit
Admission: RE | Admit: 2017-09-23 | Discharge: 2017-09-23 | Disposition: A | Discharge status: Home / Self Care | Payer: Commercial Managed Care - PPO | Source: Ambulatory Visit | Attending: Gastroenterology | Admitting: Gastroenterology

## 2017-09-23 DIAGNOSIS — K219 Gastro-esophageal reflux disease without esophagitis: Secondary | ICD-10-CM | POA: Insufficient documentation

## 2017-09-23 DIAGNOSIS — Z7984 Long term (current) use of oral hypoglycemic drugs: Secondary | ICD-10-CM | POA: Diagnosis not present

## 2017-09-23 DIAGNOSIS — F419 Anxiety disorder, unspecified: Secondary | ICD-10-CM | POA: Insufficient documentation

## 2017-09-23 DIAGNOSIS — D123 Benign neoplasm of transverse colon: Secondary | ICD-10-CM | POA: Insufficient documentation

## 2017-09-23 DIAGNOSIS — Z87891 Personal history of nicotine dependence: Secondary | ICD-10-CM | POA: Insufficient documentation

## 2017-09-23 DIAGNOSIS — F329 Major depressive disorder, single episode, unspecified: Secondary | ICD-10-CM | POA: Insufficient documentation

## 2017-09-23 DIAGNOSIS — Z6841 Body Mass Index (BMI) 40.0 and over, adult: Secondary | ICD-10-CM | POA: Insufficient documentation

## 2017-09-23 DIAGNOSIS — Z79899 Other long term (current) drug therapy: Secondary | ICD-10-CM | POA: Insufficient documentation

## 2017-09-23 DIAGNOSIS — I129 Hypertensive chronic kidney disease with stage 1 through stage 4 chronic kidney disease, or unspecified chronic kidney disease: Secondary | ICD-10-CM | POA: Diagnosis not present

## 2017-09-23 DIAGNOSIS — D509 Iron deficiency anemia, unspecified: Secondary | ICD-10-CM | POA: Diagnosis not present

## 2017-09-23 DIAGNOSIS — K3189 Other diseases of stomach and duodenum: Secondary | ICD-10-CM | POA: Diagnosis not present

## 2017-09-23 DIAGNOSIS — K5730 Diverticulosis of large intestine without perforation or abscess without bleeding: Secondary | ICD-10-CM | POA: Diagnosis not present

## 2017-09-23 DIAGNOSIS — D124 Benign neoplasm of descending colon: Secondary | ICD-10-CM | POA: Diagnosis not present

## 2017-09-23 DIAGNOSIS — N189 Chronic kidney disease, unspecified: Secondary | ICD-10-CM | POA: Insufficient documentation

## 2017-09-23 DIAGNOSIS — E7800 Pure hypercholesterolemia, unspecified: Secondary | ICD-10-CM | POA: Insufficient documentation

## 2017-09-23 DIAGNOSIS — E1122 Type 2 diabetes mellitus with diabetic chronic kidney disease: Secondary | ICD-10-CM | POA: Insufficient documentation

## 2017-09-23 DIAGNOSIS — K317 Polyp of stomach and duodenum: Secondary | ICD-10-CM | POA: Diagnosis not present

## 2017-09-23 DIAGNOSIS — E669 Obesity, unspecified: Secondary | ICD-10-CM | POA: Insufficient documentation

## 2017-09-23 HISTORY — PX: COLONOSCOPY WITH PROPOFOL: SHX5780

## 2017-09-23 HISTORY — PX: ESOPHAGOGASTRODUODENOSCOPY (EGD) WITH PROPOFOL: SHX5813

## 2017-09-23 LAB — GLUCOSE, CAPILLARY: GLUCOSE-CAPILLARY: 141 mg/dL — AB (ref 65–99)

## 2017-09-23 LAB — POCT PREGNANCY, URINE: Preg Test, Ur: NEGATIVE

## 2017-09-23 SURGERY — ESOPHAGOGASTRODUODENOSCOPY (EGD) WITH PROPOFOL
Anesthesia: General

## 2017-09-23 MED ORDER — PROPOFOL 10 MG/ML IV BOLUS
INTRAVENOUS | Status: DC | PRN
  Administered 2017-09-23: 60 mg via INTRAVENOUS
  Administered 2017-09-23: 40 mg via INTRAVENOUS

## 2017-09-23 MED ORDER — GLYCOPYRROLATE 0.2 MG/ML IJ SOLN
INTRAMUSCULAR | Status: AC
  Filled 2017-09-23: qty 1

## 2017-09-23 MED ORDER — SODIUM CHLORIDE 0.9 % IV SOLN
INTRAVENOUS | Status: DC
  Administered 2017-09-23: 10:00:00 via INTRAVENOUS

## 2017-09-23 MED ORDER — LIDOCAINE HCL (PF) 2 % IJ SOLN
INTRAMUSCULAR | Status: AC
  Filled 2017-09-23: qty 10

## 2017-09-23 MED ORDER — GLYCOPYRROLATE 0.2 MG/ML IJ SOLN
INTRAMUSCULAR | Status: DC | PRN
  Administered 2017-09-23: 0.2 mg via INTRAVENOUS

## 2017-09-23 MED ORDER — PROPOFOL 10 MG/ML IV BOLUS: INTRAVENOUS | Status: DC | PRN

## 2017-09-23 MED ORDER — LIDOCAINE 2% (20 MG/ML) 5 ML SYRINGE
INTRAMUSCULAR | Status: DC | PRN
  Administered 2017-09-23: 100 mg via INTRAVENOUS

## 2017-09-23 MED ORDER — PROPOFOL 500 MG/50ML IV EMUL
INTRAVENOUS | Status: DC | PRN
  Administered 2017-09-23: 120 ug/kg/min via INTRAVENOUS

## 2017-09-23 NOTE — Anesthesia Post-op Follow-up Note (Signed)
Anesthesia QCDR form completed.        

## 2017-09-23 NOTE — Op Note (Signed)
[facility] Gastroenterology Patient Name: Eileen Ritter Procedure Date: 09/23/2017 11:26 AM MRN: [id number] Account #: [id number] Date of Birth: 10/04/1966 Admit Type: Outpatient Age: 51 Room: [facility] ENDO ROOM 1 Gender: Female Note Status: Finalized Procedure:            Upper GI endoscopy Indications:          Iron deficiency anemia Providers:            Jonathon Bellows MD, MD Medicines:            Monitored Anesthesia Care Complications:        No immediate complications. Procedure:            Pre-Anesthesia Assessment:                       - Prior to the procedure, a History and Physical was                        performed, and patient medications, allergies and                        sensitivities were reviewed. The patient's tolerance of                        previous anesthesia was reviewed.                       - The risks and benefits of the procedure and the                        sedation options and risks were discussed with the                        patient. All questions were answered and informed                        consent was obtained.                       - ASA Grade Assessment: III - A patient with severe                        systemic disease.                       After obtaining informed consent, the endoscope was                        passed under direct vision. Throughout the procedure,                        the patient's blood pressure, pulse, and oxygen                        saturations were monitored continuously. The Endoscope                        was introduced through the mouth, and advanced to the                        third part of duodenum. The upper GI endoscopy was  accomplished without difficulty. The patient tolerated                        the procedure well. Findings:      The esophagus was normal.      The stomach was normal.      A large polypoid mass with no bleeding was found in the  duodenal bulb. Impression:           - Normal esophagus.                       - Normal stomach.                       - Duodenal mass.                       - No specimens collected. Recommendation:       - Perform a colonoscopy today.                       - 1. Will refer to [facility] for EUS of the duodenal mass                        followed by EMR vs ESD. No biopsies taken Procedure Code(s):    --- Professional ---                       [phone], Esophagogastroduodenoscopy, flexible, transoral;                        diagnostic, including collection of specimen(s) by                        brushing or washing, when performed (separate procedure) Diagnosis Code(s):    --- Professional ---                       K31.89, Other diseases of stomach and duodenum                       D50.9, Iron deficiency anemia, unspecified CPT copyright 2016 American Medical Association. All rights reserved. The codes documented in this report are preliminary and upon coder review may  be revised to meet current compliance requirements. Jonathon Bellows, MD Jonathon Bellows MD, MD 09/23/2017 11:36:27 AM This report has been signed electronically. Number of Addenda: 0 Note Initiated On: 09/23/2017 11:26 AM      [facility]

## 2017-09-23 NOTE — Op Note (Signed)
[facility] Gastroenterology Patient Name: Eileen Ritter Procedure Date: 09/23/2017 11:27 AM MRN: [id number] Account #: [id number] Date of Birth: 11/07/1966 Admit Type: Outpatient Age: 51 Room: [facility] ENDO ROOM 1 Gender: Female Note Status: Finalized Procedure:            Colonoscopy Indications:          Iron deficiency anemia Providers:            Jonathon Bellows MD, MD Medicines:            Monitored Anesthesia Care Complications:        No immediate complications. Procedure:            Pre-Anesthesia Assessment:                       - Prior to the procedure, a History and Physical was                        performed, and patient medications, allergies and                        sensitivities were reviewed. The patient's tolerance of                        previous anesthesia was reviewed.                       - The risks and benefits of the procedure and the                        sedation options and risks were discussed with the                        patient. All questions were answered and informed                        consent was obtained.                       - ASA Grade Assessment: III - A patient with severe                        systemic disease.                       After obtaining informed consent, the colonoscope was                        passed under direct vision. Throughout the procedure,                        the patient's blood pressure, pulse, and oxygen                        saturations were monitored continuously. The                        Colonoscope was introduced through the anus and                        advanced to the the cecum, identified by the  appendiceal orifice, IC valve and transillumination.                        The colonoscopy was performed with ease. The patient                        tolerated the procedure well. Findings:      The perianal and digital rectal examinations were normal.  Multiple small-mouthed diverticula were found in the sigmoid colon.      Three sessile polyps were found in the descending colon. The polyps were       3 to 5 mm in size. These polyps were removed with a cold biopsy forceps.       Resection and retrieval were complete.      The exam was otherwise without abnormality on direct and retroflexion       views. Impression:           - Diverticulosis in the sigmoid colon.                       - Three 3 to 5 mm polyps in the descending colon,                        removed with a cold biopsy forceps. Resected and                        retrieved.                       - The examination was otherwise normal on direct and                        retroflexion views. Recommendation:       - Discharge patient to home (with escort).                       - Resume previous diet.                       - Continue present medications.                       - Await pathology results.                       - Repeat colonoscopy for surveillance based on                        pathology results.                       - Return to GI office as previously scheduled. Procedure Code(s):    --- Professional ---                       [phone], Colonoscopy, flexible; with biopsy, single or                        multiple Diagnosis Code(s):    --- Professional ---                       D12.4, Benign neoplasm of descending colon  D50.9, Iron deficiency anemia, unspecified                       K57.30, Diverticulosis of large intestine without                        perforation or abscess without bleeding CPT copyright 2016 American Medical Association. All rights reserved. The codes documented in this report are preliminary and upon coder review may  be revised to meet current compliance requirements. Jonathon Bellows, MD Jonathon Bellows MD, MD 09/23/2017 12:01:10 PM This report has been signed electronically. Number of Addenda: 0 Note Initiated On:  09/23/2017 11:27 AM Scope Withdrawal Time: 0 hours 9 minutes 21 seconds  Total Procedure Duration: 0 hours 18 minutes 17 seconds       [facility]

## 2017-09-23 NOTE — Anesthesia Preprocedure Evaluation (Signed)
Anesthesia Evaluation  Patient identified by MRN, date of birth, ID band Patient awake    Reviewed: Allergy & Precautions, H&P , NPO status , Patient's Chart, lab work & pertinent test results, reviewed documented beta blocker date and time   Airway Mallampati: II   Neck ROM: full    Dental  (+) Poor Dentition   Pulmonary neg pulmonary ROS, former smoker,    Pulmonary exam normal        Cardiovascular Exercise Tolerance: Poor hypertension, On Medications negative cardio ROS Normal cardiovascular exam Rhythm:regular Rate:Normal     Neuro/Psych  Neuromuscular disease negative neurological ROS  negative psych ROS   GI/Hepatic negative GI ROS, Neg liver ROS, GERD  Medicated,  Endo/Other  negative endocrine ROSdiabetes  Renal/GU Renal diseasenegative Renal ROS  negative genitourinary   Musculoskeletal negative musculoskeletal ROS (+)   Abdominal   Peds negative pediatric ROS (+)  Hematology negative hematology ROS (+) anemia ,   Anesthesia Other Findings Past Medical History: No date: Allergic rhinitis No date: Anxiety No date: Chronic kidney disease No date: Depression No date: Diabetes ([facility]) No date: GERD (gastroesophageal reflux disease) No date: HBP (high blood pressure) No date: High cholesterol No date: Obesity Past Surgical History: No date: APPENDECTOMY No date: CYST EXCISION     Comment:  back No date: TONSILLECTOMY No date: TUBAL LIGATION BMI    Body Mass Index:  53.47 kg/m     Reproductive/Obstetrics negative OB ROS                             Anesthesia Physical Anesthesia Plan  ASA: III  Anesthesia Plan: General   Post-op Pain Management:    Induction:   PONV Risk Score and Plan:   Airway Management Planned:   Additional Equipment:   Intra-op Plan:   Post-operative Plan:   Informed Consent: I have reviewed the patients History and Physical, chart,  labs and discussed the procedure including the risks, benefits and alternatives for the proposed anesthesia with the patient or authorized representative who has indicated his/her understanding and acceptance.   Dental Advisory Given  Plan Discussed with: CRNA  Anesthesia Plan Comments:         Anesthesia Quick Evaluation

## 2017-09-23 NOTE — Transfer of Care (Signed)
Immediate Anesthesia Transfer of Care Note  Patient: Eileen Ritter  Procedure(s) Performed: ESOPHAGOGASTRODUODENOSCOPY (EGD) WITH PROPOFOL (N/A ) COLONOSCOPY WITH PROPOFOL (N/A )  Patient Location: Endoscopy Unit  Anesthesia Type:General  Level of Consciousness: awake, alert  and oriented  Airway & Oxygen Therapy: Patient connected to nasal cannula oxygen  Post-op Assessment: Post -op Vital signs reviewed and stable  Post vital signs: stable  Last Vitals:  Vitals:   09/23/17 1005 09/23/17 1203  BP: 137/77 125/75  Pulse: 78 90  Resp: 18 16  Temp: 36.6 C 36.6 C  SpO2: 98% 97%    Last Pain:  Vitals:   09/23/17 1203  TempSrc: Tympanic         Complications: No apparent anesthesia complications

## 2017-09-23 NOTE — H&P (Signed)
Jonathon Bellows MD [address] [place], [place] [address] Phone: [phone] Fax : [phone]  Primary Care Physician:  Kathrine Haddock, NP Primary Gastroenterologist:  Dr. Jonathon Bellows   Pre-Procedure History & Physical: HPI:  Eileen Ritter is a 51 y.o. female is here for an endoscopy and colonoscopy.   Past Medical History:  Diagnosis Date  . Allergic rhinitis   . Anxiety   . Chronic kidney disease   . Depression   . Diabetes ([place])   . GERD (gastroesophageal reflux disease)   . HBP (high blood pressure)   . High cholesterol   . Obesity     Past Surgical History:  Procedure Laterality Date  . APPENDECTOMY    . CYST EXCISION     back  . TONSILLECTOMY    . TUBAL LIGATION      Prior to Admission medications   Medication Sig Start Date End Date Taking? Authorizing Provider  atorvastatin (LIPITOR) 40 MG tablet TAKE 1 TABLET (40 MG TOTAL) BY MOUTH DAILY. 08/03/17  Yes Kathrine Haddock, NP  buPROPion (WELLBUTRIN SR) 150 MG 12 hr tablet TAKE 1 TABLET (150 MG TOTAL) BY MOUTH DAILY. 06/17/17  Yes Kathrine Haddock, NP  Cholecalciferol (VITAMIN D) 2000 units tablet Take 2,000 Units by mouth daily.   Yes [provider]  Ferrous Sulfate Dried (SLOW RELEASE IRON) 45 MG TBCR Take 45 mg by mouth daily.   Yes [provider]  FLUoxetine (PROZAC) 20 MG tablet TAKE 1 TABLET (20 MG TOTAL) BY MOUTH AT BEDTIME. 06/14/17  Yes Crissman, Jeannette How, MD  ibuprofen (ADVIL,MOTRIN) 200 MG tablet Take 200 mg by mouth every 4 (four) hours as needed.   Yes [provider]  INVOKANA 300 MG TABS tablet TAKE 1 TABLET (300 MG TOTAL) BY MOUTH DAILY BEFORE BREAKFAST. 08/12/17  Yes Johnson, Megan P, DO  losartan (COZAAR) 50 MG tablet TAKE 1 TABLET BY MOUTH EVERY DAY 09/10/17  Yes Kathrine Haddock, NP  metFORMIN (GLUCOPHAGE) 500 MG tablet TAKE 2 TABLETS (1,000 MG TOTAL) BY MOUTH 2 (TWO) TIMES DAILY WITH A MEAL. 07/19/17  Yes Kathrine Haddock, NP  omeprazole (PRILOSEC) 20 MG capsule TAKE 1  CAPSULE (20 MG TOTAL) BY MOUTH DAILY. 07/20/17  Yes Kathrine Haddock, NP  cyclobenzaprine (FLEXERIL) 10 MG tablet Take 1 tablet (10 mg total) by mouth as needed for muscle spasms. 09/11/15   Kathrine Haddock, NP  fluticasone (FLONASE) 50 MCG/ACT nasal spray Place 2 sprays into both nostrils daily.    [provider]  meloxicam (MOBIC) 15 MG tablet Take 1 tablet (15 mg total) by mouth daily. Patient not taking: Reported on 09/15/2017 12/18/16   Kathrine Haddock, NP  nitrofurantoin, macrocrystal-monohydrate, (MACROBID) 100 MG capsule Take 100 mg by mouth 2 (two) times daily. 07/19/17   [provider]    Allergies as of 09/15/2017  . (No Known Allergies)    Family History  Problem Relation Age of Onset  . Diabetes Mother   . Heart disease Mother   . Hypertension Mother   . Stroke Mother   . Cancer Mother        LUNG  . Mental illness Mother   . Thyroid disease Mother   . Diabetes Father   . Heart disease Father   . Diabetes Sister   . Diabetes Brother   . Heart disease Brother   . Heart disease Maternal Grandmother   . Breast cancer Neg Hx     Social History   Social History  . Marital  status: Divorced    Spouse name: N/A  . Number of children: N/A  . Years of education: N/A   Occupational History  . Not on file.   Social History Main Topics  . Smoking status: Former Smoker    Quit date: 11/01/2013  . Smokeless tobacco: Never Used  . Alcohol use No  . Drug use: No  . Sexual activity: Yes   Other Topics Concern  . Not on file   Social History Narrative  . No narrative on file    Review of Systems: See HPI, otherwise negative ROS  Physical Exam: BP 137/77   Pulse 78   Temp 97.8 F (36.6 C) (Tympanic)   Resp 18   Ht 5\' 1"  (1.549 m)   Wt 283 lb (128.4 kg)   SpO2 98%   BMI 53.47 kg/m  General:   Alert,  pleasant and cooperative in NAD Head:  Normocephalic and atraumatic. Neck:  Supple; no masses or thyromegaly. Lungs:  Clear throughout to  auscultation.    Heart:  Regular rate and rhythm. Abdomen:  Soft, nontender and nondistended. Normal bowel sounds, without guarding, and without rebound.   Neurologic:  Alert and  oriented x4;  grossly normal neurologically.  Impression/Plan: [name] is here for an endoscopy and colonoscopy to be performed for iron deficiency anemia   Risks, benefits, limitations, and alternatives regarding  endoscopy and colonoscopy have been reviewed with the patient.  Questions have been answered.  All parties agreeable.   Jonathon Bellows, MD  09/23/2017, 10:52 AM

## 2017-09-23 NOTE — Anesthesia Postprocedure Evaluation (Signed)
Anesthesia Post Note  Patient: JAIAH WEIGEL  Procedure(s) Performed: ESOPHAGOGASTRODUODENOSCOPY (EGD) WITH PROPOFOL (N/A ) COLONOSCOPY WITH PROPOFOL (N/A )  Patient location during evaluation: PACU Anesthesia Type: General Level of consciousness: awake and alert Pain management: pain level controlled Vital Signs Assessment: post-procedure vital signs reviewed and stable Respiratory status: spontaneous breathing, nonlabored ventilation, respiratory function stable and patient connected to nasal cannula oxygen Cardiovascular status: blood pressure returned to baseline and stable Postop Assessment: no apparent nausea or vomiting Anesthetic complications: no     Last Vitals:  Vitals:   09/23/17 1213 09/23/17 1222  BP: 131/69 123/67  Pulse: 72 65  Resp: 17 14  Temp:    SpO2: 99% 98%    Last Pain:  Vitals:   09/23/17 1203  TempSrc: Tympanic                 Molli Barrows

## 2017-09-24 ENCOUNTER — Encounter: Payer: Self-pay | Admitting: Gastroenterology

## 2017-09-24 LAB — SURGICAL PATHOLOGY

## 2017-09-26 ENCOUNTER — Other Ambulatory Visit: Payer: Self-pay | Admitting: Unknown Physician Specialty

## 2017-09-27 ENCOUNTER — Telehealth: Payer: Self-pay

## 2017-09-27 NOTE — Telephone Encounter (Signed)
LVM for patient callback for results per Dr. Vicente Males.   1. 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   - Sent referral to [facility] for EUS with follow-up EMR or ESD per Dr. Vicente Males.   2. Advise patient of [facility] scheduling procedure. Appointments will be made directly with patient.  3. Bx results per pathology.

## 2017-09-29 ENCOUNTER — Encounter: Payer: Self-pay | Admitting: Gastroenterology

## 2017-10-04 ENCOUNTER — Ambulatory Visit (INDEPENDENT_AMBULATORY_CARE_PROVIDER_SITE_OTHER): Payer: Commercial Managed Care - PPO | Admitting: Unknown Physician Specialty

## 2017-10-04 ENCOUNTER — Encounter: Payer: Self-pay | Admitting: Unknown Physician Specialty

## 2017-10-04 VITALS — BP 132/80 | HR 80 | Temp 98.1°F | Wt 281.8 lb

## 2017-10-04 DIAGNOSIS — D500 Iron deficiency anemia secondary to blood loss (chronic): Secondary | ICD-10-CM

## 2017-10-04 DIAGNOSIS — E538 Deficiency of other specified B group vitamins: Secondary | ICD-10-CM | POA: Insufficient documentation

## 2017-10-04 DIAGNOSIS — N920 Excessive and frequent menstruation with regular cycle: Secondary | ICD-10-CM

## 2017-10-04 LAB — CBC WITH DIFFERENTIAL/PLATELET
HEMATOCRIT: 38.4 % (ref 34.0–46.6)
HEMOGLOBIN: 11.8 g/dL (ref 11.1–15.9)
Lymphocytes Absolute: 4.3 10*3/uL — ABNORMAL HIGH (ref 0.7–3.1)
Lymphs: 34 %
MCH: 21.1 pg — AB (ref 26.6–33.0)
MCHC: 30.7 g/dL — ABNORMAL LOW (ref 31.5–35.7)
MCV: 69 fL — ABNORMAL LOW (ref 79–97)
MID (Absolute): 1.4 10*3/uL (ref 0.1–1.6)
MID: 11 %
NEUTROS ABS: 7 10*3/uL (ref 1.4–7.0)
Neutrophils: 55 %
Platelets: 438 10*3/uL — ABNORMAL HIGH (ref 150–379)
RBC: 5.6 x10E6/uL — AB (ref 3.77–5.28)
RDW: 21.1 % — ABNORMAL HIGH (ref 12.3–15.4)
WBC: 12.7 10*3/uL — AB (ref 3.4–10.8)

## 2017-10-04 MED ORDER — CYANOCOBALAMIN 1000 MCG/ML IJ SOLN
1000.0000 ug | INTRAMUSCULAR | Status: AC
  Administered 2017-10-04 – 2017-10-25 (×3): 1000 ug via INTRAMUSCULAR

## 2017-10-04 NOTE — Assessment & Plan Note (Addendum)
New problem.  Weekly injections times 4 and then monthly

## 2017-10-04 NOTE — Assessment & Plan Note (Addendum)
H/H is normal.  She is microcytic.  Continue present iron dosing as doing well.    Refer to gyn for further evaluation of heavy menses as high risk

## 2017-10-04 NOTE — Progress Notes (Signed)
   BP 132/80   Pulse 80   Temp 98.1 F (36.7 C)   Wt 281 lb 12.8 oz (127.8 kg)   SpO2 98%   BMI 53.25 kg/m    Subjective:    Patient ID: Eileen Ritter, female    DOB: Sep 30, 1966, 51 y.o.   MRN: [id number]  HPI: Eileen Ritter is a 51 y.o. female  Chief Complaint  Patient presents with  . Anemia   Pt had completed her GI workup.  3 polyps removed but has a large polyp noted on endoscopy.  No source of bleeding noted.  Reviewed scans with patient and discussed concerns  Heavy menses.  Taking 1 iron tablet/day.    B12 deficiency - noted low B12 last visit of 190.  GI asked her to follow-up with [place]  Relevant past medical, surgical, family and social history reviewed and updated as indicated. Interim medical history since our last visit reviewed. Allergies and medications reviewed and updated.  Review of Systems  Per HPI unless specifically indicated above     Objective:    BP 132/80   Pulse 80   Temp 98.1 F (36.7 C)   Wt 281 lb 12.8 oz (127.8 kg)   SpO2 98%   BMI 53.25 kg/m   Wt Readings from Last 3 Encounters:  10/04/17 281 lb 12.8 oz (127.8 kg)  09/23/17 283 lb (128.4 kg)  09/15/17 283 lb 9.6 oz (128.6 kg)    Physical Exam  Constitutional: She is oriented to person, place, and time. She appears well-developed and well-nourished. No distress.  HENT:  Head: Normocephalic and atraumatic.  Eyes: Conjunctivae and lids are normal. Right eye exhibits no discharge. Left eye exhibits no discharge. No scleral icterus.  Neck: Normal range of motion. Neck supple. No JVD present. Carotid bruit is not present.  Cardiovascular: Normal rate, regular rhythm and normal heart sounds.   Pulmonary/Chest: Effort normal and breath sounds normal.  Abdominal: Normal appearance. There is no splenomegaly or hepatomegaly.  Musculoskeletal: Normal range of motion.  Neurological: She is alert and oriented to person, place, and time.  Skin: Skin is warm, dry and intact. No rash noted. No  pallor.  Psychiatric: She has a normal mood and affect. Her behavior is normal. Judgment and thought content normal.      Assessment & Plan:   Problem List Items Addressed This Visit      Unprioritized   B12 deficiency - Primary    New problem.  Weekly injections times 4 and then monthly      Relevant Medications   cyanocobalamin ((VITAMIN B-12)) injection 1,000 mcg   Iron deficiency anemia    H/H is normal.  She is microcytic.  Continue present iron dosing as doing well.    Refer to gyn for further evaluation of heavy menses as high risk      Relevant Medications   cyanocobalamin ((VITAMIN B-12)) injection 1,000 mcg   Other Relevant Orders   CBC With Differential/Platelet   Menorrhagia   Relevant Orders   Ambulatory referral to Gynecology       Follow up plan: Return in about 1 week (around 10/11/2017) for nurse visit.  ! month with me.

## 2017-10-11 ENCOUNTER — Ambulatory Visit (INDEPENDENT_AMBULATORY_CARE_PROVIDER_SITE_OTHER): Payer: Commercial Managed Care - PPO

## 2017-10-11 DIAGNOSIS — E538 Deficiency of other specified B group vitamins: Secondary | ICD-10-CM

## 2017-10-18 ENCOUNTER — Ambulatory Visit (INDEPENDENT_AMBULATORY_CARE_PROVIDER_SITE_OTHER): Payer: Commercial Managed Care - PPO | Admitting: Obstetrics and Gynecology

## 2017-10-18 ENCOUNTER — Ambulatory Visit (INDEPENDENT_AMBULATORY_CARE_PROVIDER_SITE_OTHER): Payer: Commercial Managed Care - PPO

## 2017-10-18 ENCOUNTER — Encounter: Payer: Self-pay | Admitting: Obstetrics and Gynecology

## 2017-10-18 VITALS — BP 138/80 | HR 84 | Ht 62.0 in | Wt 282.0 lb

## 2017-10-18 DIAGNOSIS — N920 Excessive and frequent menstruation with regular cycle: Secondary | ICD-10-CM

## 2017-10-18 DIAGNOSIS — E538 Deficiency of other specified B group vitamins: Secondary | ICD-10-CM | POA: Diagnosis not present

## 2017-10-18 DIAGNOSIS — D500 Iron deficiency anemia secondary to blood loss (chronic): Secondary | ICD-10-CM | POA: Diagnosis not present

## 2017-10-18 NOTE — Progress Notes (Signed)
Chief Complaint  Patient presents with  . Referral    Eileen Ritter Referred for Menorrhaghia    HPI:      Eileen Ritter is a 51 y.o. No obstetric history on file. who LMP was Patient's last menstrual period was 10/05/2017., presents today for NP eval of menorrhagia, referred by PCP Eileen Ritter, Eileen Mood, NP). Pt with a hx of iron deficiency anemia in the past year with neg GI eval, so question menstrual cycles as etiology. Menses are Q1-2 months, 5-7 days, 3-4 heavy days changing pads Q1-2 hrs, soiling bed at night. Pt with 1/2 dollar sized clots and dysmen, improved with NSAIDs. Pt's menses heavier the past 2 yrs. She also has occas pelvic pain, particularly with ovulation.  Pt is sex active, hx of TL in her 31s. Occas dyspareunia but no postcoital bleeding. Pt with normal TSH 8/18.  Pt current on paps with PCP.    Past Medical History:  Diagnosis Date  . Allergic rhinitis   . Anxiety   . Chronic kidney disease   . Depression   . Diabetes ([place])   . GERD (gastroesophageal reflux disease)   . HBP (high blood pressure)   . High cholesterol   . Obesity     Past Surgical History:  Procedure Laterality Date  . APPENDECTOMY    . CYST EXCISION     back  . TONSILLECTOMY    . TUBAL LIGATION      Family History  Problem Relation Age of Onset  . Diabetes Mother   . Heart disease Mother   . Hypertension Mother   . Stroke Mother   . Cancer Mother        LUNG  . Mental illness Mother   . Thyroid disease Mother   . Diabetes Father   . Heart disease Father   . Diabetes Sister   . Diabetes Brother   . Heart disease Brother   . Heart disease Maternal Grandmother   . Breast cancer Neg Hx     Social History   Socioeconomic History  . Marital status: Divorced    Spouse name: Not on file  . Number of children: Not on file  . Years of education: Not on file  . Highest education level: Not on file  Social Needs  . Financial resource strain: Not on file  . Food  insecurity - worry: Not on file  . Food insecurity - inability: Not on file  . Transportation needs - medical: Not on file  . Transportation needs - non-medical: Not on file  Occupational History  . Not on file  Tobacco Use  . Smoking status: Former Smoker    Last attempt to quit: 11/01/2013    Years since quitting: 3.9  . Smokeless tobacco: Never Used  Substance and Sexual Activity  . Alcohol use: No  . Drug use: No  . Sexual activity: Yes  Other Topics Concern  . Not on file  Social History Narrative  . Not on file     Current Outpatient Medications:  .  atorvastatin (LIPITOR) 40 MG tablet, TAKE 1 TABLET (40 MG TOTAL) BY MOUTH DAILY., Disp: 90 tablet, Rfl: 1 .  buPROPion (WELLBUTRIN SR) 150 MG 12 hr tablet, TAKE 1 TABLET (150 MG TOTAL) BY MOUTH DAILY., Disp: 180 tablet, Rfl: 1 .  Cholecalciferol (VITAMIN D) 2000 units tablet, Take 2,000 Units by mouth daily., Disp: , Rfl:  .  cyclobenzaprine (FLEXERIL) 10 MG tablet, Take 1 tablet (10 mg total)  by mouth as needed for muscle spasms., Disp: 90 tablet, Rfl: 1 .  Ferrous Sulfate Dried (SLOW RELEASE IRON) 45 MG TBCR, Take 45 mg by mouth daily., Disp: , Rfl:  .  FLUoxetine (PROZAC) 20 MG tablet, TAKE 1 TABLET (20 MG TOTAL) BY MOUTH AT BEDTIME., Disp: 90 tablet, Rfl: 1 .  fluticasone (FLONASE) 50 MCG/ACT nasal spray, Place 2 sprays into both nostrils daily., Disp: , Rfl:  .  INVOKANA 300 MG TABS tablet, TAKE 1 TABLET (300 MG TOTAL) BY MOUTH DAILY BEFORE BREAKFAST., Disp: 90 tablet, Rfl: 3 .  losartan (COZAAR) 50 MG tablet, TAKE 1 TABLET BY MOUTH EVERY DAY, Disp: 90 tablet, Rfl: 0 .  metFORMIN (GLUCOPHAGE) 500 MG tablet, TAKE 2 TABLETS (1,000 MG TOTAL) BY MOUTH 2 (TWO) TIMES DAILY WITH A MEAL., Disp: 240 tablet, Rfl: 1 .  omeprazole (PRILOSEC) 20 MG capsule, TAKE 1 CAPSULE (20 MG TOTAL) BY MOUTH DAILY., Disp: 90 capsule, Rfl: 1 .  Probiotic Product (PROBIOTIC PO), Take 2 tablets by mouth daily., Disp: , Rfl:  .  meloxicam (MOBIC) 15 MG  tablet, Take 1 tablet (15 mg total) by mouth daily. (Patient not taking: Reported on 09/15/2017), Disp: 30 tablet, Rfl: 0  Current Facility-Administered Medications:  .  cyanocobalamin ((VITAMIN B-12)) injection 1,000 mcg, 1,000 mcg, Intramuscular, Weekly, Kathrine Haddock, NP, 1,000 mcg at 10/11/17 0819   ROS:  Review of Systems  Constitutional: Negative for fever.  Gastrointestinal: Negative for blood in stool, constipation, diarrhea, nausea and vomiting.  Genitourinary: Positive for menstrual problem and pelvic pain. Negative for dyspareunia, dysuria, flank pain, frequency, hematuria, urgency, vaginal bleeding, vaginal discharge and vaginal pain.  Musculoskeletal: Negative for back pain.  Skin: Negative for rash.     OBJECTIVE:   Vitals:  BP 138/80 (BP Location: Left Arm, Patient Position: Sitting, Cuff Size: Large)   Pulse 84   Ht 5\' 2"  (1.575 m)   Wt 282 lb (127.9 kg)   LMP 10/05/2017   BMI 51.58 kg/m   Physical Exam  Constitutional: She is oriented to person, place, and time and well-developed, well-nourished, and in no distress. Vital signs are normal.  Abdominal: Soft. Normal appearance. There is no tenderness. There is no rebound.  Genitourinary: Vagina normal, uterus normal, cervix normal, right adnexa normal, left adnexa normal and vulva normal. Uterus is not tender. Cervix exhibits no motion tenderness and no tenderness. Right adnexum displays no mass and no tenderness. Left adnexum displays no mass and no tenderness. Vulva exhibits no erythema, no exudate, no lesion, no rash and no tenderness. Vagina exhibits no lesion.  Genitourinary Comments: EXAM DIFFICULT DUE TO CENTRAL BODY HABITUS; QUESTION ENLARGED UTERUS  Neurological: She is oriented to person, place, and time.  Vitals reviewed.   Assessment/Plan: Menorrhagia with regular cycle - Check GYN u/s. Will f/u with results. Discussed leio, and sx control with BC (would have to be prog only), IUD, ablation, lysteda.   - Plan: US PELVIS TRANSVANGINAL NON-OB (TV ONLY)  Iron deficiency anemia due to chronic blood loss - Most likely due to menorrhagia.    Return in about 1 day (around 10/19/2017) for GYN u/s--[facility] to call pt.  Eileen B. Copland, PA-C 10/18/2017 4:34 PM

## 2017-10-18 NOTE — Patient Instructions (Signed)
I value your feedback and appreciate you entrusting us with your care. If you get a [facility] patient survey, I would appreciate you taking the time to let us know what your experience was like. Thank you! 

## 2017-10-19 ENCOUNTER — Ambulatory Visit (INDEPENDENT_AMBULATORY_CARE_PROVIDER_SITE_OTHER): Payer: Commercial Managed Care - PPO

## 2017-10-19 ENCOUNTER — Telehealth: Payer: Self-pay | Admitting: Obstetrics and Gynecology

## 2017-10-19 DIAGNOSIS — N920 Excessive and frequent menstruation with regular cycle: Secondary | ICD-10-CM | POA: Diagnosis not present

## 2017-10-19 NOTE — Telephone Encounter (Addendum)
Pt aware of neg u/s. Pt without DUB sx. Discussed IUD, ablation, lysteda, or watch and wait. Pt states menses are tolerable and iron levels improved with Fe supp on last CBC. She would like to follow sx expectantly for now. She will f/u prn.

## 2017-10-25 ENCOUNTER — Ambulatory Visit (INDEPENDENT_AMBULATORY_CARE_PROVIDER_SITE_OTHER): Payer: Commercial Managed Care - PPO

## 2017-10-25 DIAGNOSIS — E538 Deficiency of other specified B group vitamins: Secondary | ICD-10-CM

## 2017-10-27 ENCOUNTER — Encounter: Payer: Self-pay | Admitting: Unknown Physician Specialty

## 2017-10-27 ENCOUNTER — Ambulatory Visit (INDEPENDENT_AMBULATORY_CARE_PROVIDER_SITE_OTHER): Payer: Commercial Managed Care - PPO | Admitting: Unknown Physician Specialty

## 2017-10-27 VITALS — BP 121/75 | HR 76 | Temp 98.2°F | Wt 281.6 lb

## 2017-10-27 DIAGNOSIS — E1122 Type 2 diabetes mellitus with diabetic chronic kidney disease: Secondary | ICD-10-CM | POA: Diagnosis not present

## 2017-10-27 DIAGNOSIS — D500 Iron deficiency anemia secondary to blood loss (chronic): Secondary | ICD-10-CM

## 2017-10-27 DIAGNOSIS — E538 Deficiency of other specified B group vitamins: Secondary | ICD-10-CM | POA: Diagnosis not present

## 2017-10-27 DIAGNOSIS — I10 Essential (primary) hypertension: Secondary | ICD-10-CM | POA: Diagnosis not present

## 2017-10-27 LAB — BAYER DCA HB A1C WAIVED: HB A1C (BAYER DCA - WAIVED): 7.1 % — ABNORMAL HIGH (ref <7.0)

## 2017-10-27 NOTE — Progress Notes (Signed)
BP 121/75   Pulse 76   Temp 98.2 F (36.8 C) (Oral)   Wt 281 lb 9.6 oz (127.7 kg)   LMP 10/05/2017   SpO2 96%   BMI 51.51 kg/m    Subjective:    Patient ID: Eileen Ritter, female    DOB: Jun 30, 1966, 51 y.o.   MRN: [id number]  HPI: Eileen Ritter is a 51 y.o. female  Chief Complaint  Patient presents with  . Depression  . Diabetes  . Hyperlipidemia  . Hypertension   Diabetes: Using medications without difficulties No hypoglycemic episodes No hyperglycemic episodes Feet problems: none Blood Sugars averaging: she has been sick so a little high eye exam within last year Last Hgb A1C: 7.2  Hypertension  Using medications without difficulty Average home BPs    Using medication without problems or lightheadedness No chest pain with exertion or shortness of breath No Edema  Elevated Cholesterol Using medications without problems No Muscle aches  Diet: Exercise: In general does well with comfort food at times  Anemia Pt following with OBGYN.  Deciding to not take any medication to alleviate dysmenorrhea at this time.  Taking iron supplement once a day  Depression screen [facility] 2/9 10/27/2017 07/27/2017 04/26/2017 10/26/2016 09/11/2015  Decreased Interest 0 0 0 0 1  Down, Depressed, Hopeless 1 0 0 0 0  PHQ - 2 Score 1 0 0 0 1  Altered sleeping 0 0 - 0 -  Tired, decreased energy 0 0 - 0 -  Change in appetite 1 0 - 0 -  Feeling bad or failure about yourself  0 0 - 0 -  Trouble concentrating 0 0 - 0 -  Moving slowly or fidgety/restless 0 0 - 0 -  Suicidal thoughts 0 0 - 0 -  PHQ-9 Score 2 0 - 0 -    Relevant past medical, surgical, family and social history reviewed and updated as indicated. Interim medical history since our last visit reviewed. Allergies and medications reviewed and updated.  Review of Systems  Constitutional: Negative.   Respiratory: Negative.   Cardiovascular: Negative.     Per HPI unless specifically indicated above     Objective:      BP 121/75   Pulse 76   Temp 98.2 F (36.8 C) (Oral)   Wt 281 lb 9.6 oz (127.7 kg)   LMP 10/05/2017   SpO2 96%   BMI 51.51 kg/m   Wt Readings from Last 3 Encounters:  10/27/17 281 lb 9.6 oz (127.7 kg)  10/18/17 282 lb (127.9 kg)  10/04/17 281 lb 12.8 oz (127.8 kg)    Physical Exam  Constitutional: She is oriented to person, place, and time. She appears well-developed and well-nourished. No distress.  HENT:  Head: Normocephalic and atraumatic.  Eyes: Conjunctivae and lids are normal. Right eye exhibits no discharge. Left eye exhibits no discharge. No scleral icterus.  Neck: Normal range of motion. Neck supple. No JVD present. Carotid bruit is not present.  Cardiovascular: Normal rate, regular rhythm and normal heart sounds.  Pulmonary/Chest: Effort normal and breath sounds normal.  Abdominal: Normal appearance. There is no splenomegaly or hepatomegaly.  Musculoskeletal: Normal range of motion.  Neurological: She is alert and oriented to person, place, and time.  Skin: Skin is warm, dry and intact. No rash noted. No pallor.  Psychiatric: She has a normal mood and affect. Her behavior is normal. Judgment and thought content normal.    Results for orders placed or performed in visit on 10/04/17  CBC With Differential/Platelet  Result Value Ref Range   WBC 12.7 (H) 3.4 - 10.8 x10E3/uL   RBC 5.60 (H) 3.77 - 5.28 x10E6/uL   Hemoglobin 11.8 11.1 - 15.9 g/dL   Hematocrit 38.4 34.0 - 46.6 %   MCV 69 (L) 79 - 97 fL   MCH 21.1 (L) 26.6 - 33.0 pg   MCHC 30.7 (L) 31.5 - 35.7 g/dL   RDW 21.1 (H) 12.3 - 15.4 %   Platelets 438 (H) 150 - 379 x10E3/uL   Neutrophils 55 Not Estab. %   Lymphs 34 Not Estab. %   MID 11 Not Estab. %   Neutrophils Absolute 7.0 1.4 - 7.0 x10E3/uL   Lymphocytes Absolute 4.3 (H) 0.7 - 3.1 x10E3/uL   MID (Absolute) 1.4 0.1 - 1.6 X10E3/uL      Assessment & Plan:   Problem List Items Addressed This Visit      Unprioritized   B12 deficiency    Took weekly  shots 4 times.  Will transition to oral      HTN (hypertension)    Stable, continue present medications.        Iron deficiency anemia - Primary    H/H 12/38.9.  Microcytic.  Will continue iron therapy.      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  Work on diet and exercise.  Recheck in 3 months      Relevant Orders   Bayer DCA Hb A1c Waived   Comprehensive metabolic panel       Follow up plan: Return in about 3 months (around 01/27/2018).

## 2017-10-27 NOTE — Assessment & Plan Note (Signed)
Stable, continue present medications.   

## 2017-10-27 NOTE — Assessment & Plan Note (Signed)
H/H 12/38.9.  Microcytic.  Will continue iron therapy.

## 2017-10-27 NOTE — Assessment & Plan Note (Signed)
Hgb A1C 7.1.  Work on diet and exercise.  Recheck in 3 months

## 2017-10-27 NOTE — Assessment & Plan Note (Signed)
Took weekly shots 4 times.  Will transition to oral

## 2017-10-28 LAB — COMPREHENSIVE METABOLIC PANEL
A/G RATIO: 1.6 (ref 1.2–2.2)
ALBUMIN: 4.1 g/dL (ref 3.5–5.5)
ALT: 20 IU/L (ref 0–32)
AST: 13 IU/L (ref 0–40)
Alkaline Phosphatase: 76 IU/L (ref 39–117)
BILIRUBIN TOTAL: 0.2 mg/dL (ref 0.0–1.2)
BUN / CREAT RATIO: 22 (ref 9–23)
BUN: 15 mg/dL (ref 6–24)
CHLORIDE: 99 mmol/L (ref 96–106)
CO2: 23 mmol/L (ref 20–29)
Calcium: 9.6 mg/dL (ref 8.7–10.2)
Creatinine, Ser: 0.69 mg/dL (ref 0.57–1.00)
GFR calc non Af Amer: 103 mL/min/{1.73_m2} (ref >59)
GFR, EST AFRICAN AMERICAN: 118 mL/min/{1.73_m2} (ref >59)
GLOBULIN, TOTAL: 2.5 g/dL (ref 1.5–4.5)
Glucose: 144 mg/dL — ABNORMAL HIGH (ref 65–99)
POTASSIUM: 5.3 mmol/L — AB (ref 3.5–5.2)
Sodium: 135 mmol/L (ref 134–144)
TOTAL PROTEIN: 6.6 g/dL (ref 6.0–8.5)

## 2017-10-28 LAB — CBC WITH DIFFERENTIAL/PLATELET
HEMATOCRIT: 38.9 % (ref 34.0–46.6)
HEMOGLOBIN: 12 g/dL (ref 11.1–15.9)
LYMPHS ABS: 3.6 10*3/uL — AB (ref 0.7–3.1)
Lymphs: 33 %
MCH: 21.2 pg — ABNORMAL LOW (ref 26.6–33.0)
MCHC: 30.8 g/dL — AB (ref 31.5–35.7)
MCV: 69 fL — ABNORMAL LOW (ref 79–97)
MID (ABSOLUTE): 1.2 10*3/uL (ref 0.1–1.6)
MID: 11 %
NEUTROS PCT: 56 %
Neutrophils Absolute: 6 10*3/uL (ref 1.4–7.0)
Platelets: 397 10*3/uL — ABNORMAL HIGH (ref 150–379)
RBC: 5.65 x10E6/uL — ABNORMAL HIGH (ref 3.77–5.28)
RDW: 21 % — ABNORMAL HIGH (ref 12.3–15.4)
WBC: 10.8 10*3/uL (ref 3.4–10.8)

## 2017-12-09 ENCOUNTER — Other Ambulatory Visit: Payer: Self-pay | Admitting: Family Medicine

## 2017-12-09 ENCOUNTER — Other Ambulatory Visit: Payer: Self-pay | Admitting: Unknown Physician Specialty

## 2017-12-16 ENCOUNTER — Other Ambulatory Visit: Payer: Self-pay

## 2017-12-16 ENCOUNTER — Telehealth: Payer: Self-pay

## 2017-12-16 DIAGNOSIS — D500 Iron deficiency anemia secondary to blood loss (chronic): Secondary | ICD-10-CM

## 2017-12-16 NOTE — Telephone Encounter (Signed)
LVM for patient callback for information per Dr. Vicente Males.   Received Upper GI Endoscopy report from [facility].   Requesting office visit and labs prior to next visit.   Labs: Iron, Ferritin, TIBC, CBC.  Contacted [facility] for pathology

## 2017-12-21 ENCOUNTER — Telehealth: Payer: Self-pay | Admitting: Gastroenterology

## 2017-12-21 NOTE — Telephone Encounter (Signed)
Patient left a voice message that she has a question regarding results from Dr. Stephanie Acre. She ask that you please call her.

## 2017-12-31 ENCOUNTER — Other Ambulatory Visit
Admission: RE | Admit: 2017-12-31 | Discharge: 2017-12-31 | Disposition: A | Discharge status: Home / Self Care | Payer: Commercial Managed Care - PPO | Source: Ambulatory Visit | Attending: Gastroenterology | Admitting: Gastroenterology

## 2017-12-31 DIAGNOSIS — D500 Iron deficiency anemia secondary to blood loss (chronic): Secondary | ICD-10-CM | POA: Insufficient documentation

## 2017-12-31 LAB — CBC WITH DIFFERENTIAL/PLATELET
BASOS PCT: 0 %
Basophils Absolute: 0 10*3/uL (ref 0–0.1)
EOS PCT: 6 %
Eosinophils Absolute: 0.7 10*3/uL (ref 0–0.7)
HEMATOCRIT: 40.7 % (ref 35.0–47.0)
Hemoglobin: 12.8 g/dL (ref 12.0–16.0)
Lymphocytes Relative: 33 %
Lymphs Abs: 3.8 10*3/uL — ABNORMAL HIGH (ref 1.0–3.6)
MCH: 22.1 pg — ABNORMAL LOW (ref 26.0–34.0)
MCHC: 31.4 g/dL — AB (ref 32.0–36.0)
MCV: 70.6 fL — AB (ref 80.0–100.0)
MONO ABS: 1 10*3/uL — AB (ref 0.2–0.9)
MONOS PCT: 9 %
NEUTROS ABS: 6.1 10*3/uL (ref 1.4–6.5)
Neutrophils Relative %: 52 %
PLATELETS: 351 10*3/uL (ref 150–440)
RBC: 5.76 MIL/uL — ABNORMAL HIGH (ref 3.80–5.20)
RDW: 19.9 % — AB (ref 11.5–14.5)
WBC: 11.6 10*3/uL — ABNORMAL HIGH (ref 3.6–11.0)

## 2018-01-02 NOTE — Progress Notes (Signed)
Jonathon Bellows MD, MRCP(U.K) [address]  [place]  [place], [place] [address]  Main: [phone]  Fax: [phone]   Primary Care Physician: Kathrine Haddock, NP  Primary Gastroenterologist:  Dr. Jonathon Bellows   Chief Complaint  Patient presents with  . Follow-up    HPI: Eileen Ritter is a 52 y.o. female   Summary of history :  She was seen initially in 09/2017 when referred for iron deficiency anemia, Labs 07/2017 Hb 11 with MCV 66 liver function tests's were normal. No older CBC to compare with. Mother possibly had colon polyps. She used to " a lot of ibuprofen" every day for months, stopped last few months back.She used to take it for her knee.  Labs 09/2017 showed low iron , b12, urine shows no blood. Celiac serology negative.   Interval history   09/23/2017-  01/02/2017   09/23/17: Colonoscopy tubular adenoma x1 resected.   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 Unclear if the polyp was resected after the band was placed or expected to drop off . Plan from [facility] is for repeat EGD in 6 months.   On iron replacement therapy and b12 therapy   CBC Latest Ref Rng & Units 12/31/2017 10/27/2017 10/04/2017  WBC 3.6 - 11.0 K/uL 11.6(H) 10.8 12.7(H)  Hemoglobin 12.0 - 16.0 g/dL 12.8 12.0 11.8  Hematocrit 35.0 - 47.0 % 40.7 38.9 38.4  Platelets 150 - 440 K/uL 351 397(H) 438(H)      Current Outpatient Medications  Medication Sig Dispense Refill  . Ascorbic Acid (VITAMIN C) 100 MG tablet Take 100 mg by mouth daily.    Marland Kitchen atorvastatin (LIPITOR) 40 MG tablet TAKE 1 TABLET (40 MG TOTAL) BY MOUTH DAILY. 90 tablet 1  . buPROPion (WELLBUTRIN SR) 150 MG 12 hr tablet TAKE 1 TABLET (150 MG TOTAL) BY MOUTH DAILY. 180 tablet 1  .  Cholecalciferol (VITAMIN D) 2000 units tablet Take 2,000 Units by mouth daily.    . cyclobenzaprine (FLEXERIL) 10 MG tablet Take 1 tablet (10 mg total) by mouth as needed for muscle spasms. 90 tablet 1  . Ferrous Sulfate Dried (SLOW RELEASE IRON) 45 MG TBCR Take 45 mg by mouth daily.    . Fluoxetine HCl, PMDD, 20 MG TABS TAKE 1 TABLET BY MOUTH EVERYDAY AT BEDTIME  1  . fluticasone (FLONASE) 50 MCG/ACT nasal spray Place 2 sprays into both nostrils daily.    . INVOKANA 300 MG TABS tablet TAKE 1 TABLET (300 MG TOTAL) BY MOUTH DAILY BEFORE BREAKFAST. 90 tablet 3  . losartan (COZAAR) 50 MG tablet TAKE 1 TABLET BY MOUTH EVERY DAY 90 tablet 0  . metFORMIN (GLUCOPHAGE) 500 MG tablet TAKE 2 TABLETS (1,000 MG TOTAL) BY MOUTH 2 (TWO) TIMES DAILY WITH A MEAL. 240 tablet 1  . omeprazole (PRILOSEC) 20 MG capsule TAKE 1 CAPSULE (20 MG TOTAL) BY MOUTH DAILY. 90 capsule 1  . Probiotic Product (PROBIOTIC PO) Take 2 tablets by mouth daily.    . vitamin B-12 (CYANOCOBALAMIN) 100 MCG tablet Take 100 mcg by mouth daily.     No current facility-administered medications for this visit.     Allergies as of 01/03/2018  . (No Known Allergies)  ROS:  General: Negative for anorexia, weight loss, fever, chills, fatigue, weakness. ENT: Negative for hoarseness, difficulty swallowing , nasal congestion. CV: Negative for chest pain, angina, palpitations, dyspnea on exertion, peripheral edema.  Respiratory: Negative for dyspnea at rest, dyspnea on exertion, cough, sputum, wheezing.  GI: See history of present illness. GU:  Negative for dysuria, hematuria, urinary incontinence, urinary frequency, nocturnal urination.  Endo: Negative for unusual weight change.    Physical Examination:   There were no vitals taken for this visit.  General: Well-nourished, well-developed in no acute distress.  Eyes: No icterus. Conjunctivae pink. Mouth: Oropharyngeal mucosa moist and pink , no lesions erythema or exudate. Lungs:  Clear to auscultation bilaterally. Non-labored. Heart: Regular rate and rhythm, no murmurs rubs or gallops.  Abdomen: Bowel sounds are normal, nontender, nondistended, no hepatosplenomegaly or masses, no abdominal bruits or hernia , no rebound or guarding.   Extremities: No lower extremity edema. No clubbing or deformities. Neuro: Alert and oriented x 3.  Grossly intact. Skin: Warm and dry, no jaundice.   Psych: Alert and cooperative, normal mood and affect.   Imaging Studies: No results found.  Assessment and Plan:   Eileen Ritter is a 52 y.o. y/o female  here to follow up for iron deficiency anemia. EGD+colonoscopy revealed no source for the anemia but during EGD noted a duodenal polyp which was referred to [facility] for excision .They performed an EUS Sample bx was taken and a ligation band placed at the base- expect the polyp to drop off.Pathology shows  duodenal grade 1 carcinoid- unclear if resected completely or not .  Plan  1. Capsule study of the small bowel to complete evaluation of iron deficiency anemia as no source yet found 2. Refer to oncology Dr Earlie Server to discuss further steps in management of carcinoid with Multi disciplinary discussion needed to discuss next steps. In general the size of the tumor determines the nature of resection method.  3. Continue oral iron and B12.   Risks, benefits, alternatives of Givens capsule discussed with patient to include but not limited to the rare risk of Given's capsule becoming lodged in the GI tract requiring surgical removal.  The patient agrees with this plan & consent will be obtained.   Dr Jonathon Bellows  MD,MRCP [facility]) Follow up in 6 weeks

## 2018-01-03 ENCOUNTER — Ambulatory Visit (INDEPENDENT_AMBULATORY_CARE_PROVIDER_SITE_OTHER): Payer: Commercial Managed Care - PPO | Admitting: Gastroenterology

## 2018-01-03 ENCOUNTER — Encounter: Payer: Self-pay | Admitting: Gastroenterology

## 2018-01-03 ENCOUNTER — Other Ambulatory Visit: Payer: Self-pay | Admitting: Unknown Physician Specialty

## 2018-01-03 VITALS — BP 161/86 | HR 83 | Temp 97.8°F | Ht 62.0 in | Wt 286.2 lb

## 2018-01-03 DIAGNOSIS — D3A019 Benign carcinoid tumor of the small intestine, unspecified portion: Secondary | ICD-10-CM | POA: Diagnosis not present

## 2018-01-03 DIAGNOSIS — D509 Iron deficiency anemia, unspecified: Secondary | ICD-10-CM

## 2018-01-04 ENCOUNTER — Encounter: Payer: Self-pay | Admitting: Gastroenterology

## 2018-01-11 ENCOUNTER — Encounter: Payer: Self-pay | Admitting: Gastroenterology

## 2018-01-13 ENCOUNTER — Inpatient Hospital Stay: Payer: Commercial Managed Care - PPO | Attending: Oncology | Admitting: Oncology

## 2018-01-13 ENCOUNTER — Encounter: Payer: Self-pay | Admitting: Oncology

## 2018-01-13 ENCOUNTER — Inpatient Hospital Stay: Payer: Commercial Managed Care - PPO

## 2018-01-13 VITALS — BP 137/80 | HR 84 | Temp 98.5°F | Wt 285.5 lb

## 2018-01-13 DIAGNOSIS — C7A010 Malignant carcinoid tumor of the duodenum: Secondary | ICD-10-CM | POA: Diagnosis present

## 2018-01-13 DIAGNOSIS — C7A8 Other malignant neuroendocrine tumors: Secondary | ICD-10-CM

## 2018-01-13 DIAGNOSIS — D500 Iron deficiency anemia secondary to blood loss (chronic): Secondary | ICD-10-CM

## 2018-01-13 HISTORY — DX: Iron deficiency anemia secondary to blood loss (chronic): D50.0

## 2018-01-13 LAB — CBC WITH DIFFERENTIAL/PLATELET
Basophils Absolute: 0.1 10*3/uL (ref 0–0.1)
Basophils Relative: 1 %
EOS ABS: 0.5 10*3/uL (ref 0–0.7)
Eosinophils Relative: 4 %
HCT: 40.9 % (ref 35.0–47.0)
Hemoglobin: 12.8 g/dL (ref 12.0–16.0)
LYMPHS ABS: 4 10*3/uL — AB (ref 1.0–3.6)
Lymphocytes Relative: 37 %
MCH: 22.2 pg — AB (ref 26.0–34.0)
MCHC: 31.4 g/dL — AB (ref 32.0–36.0)
MCV: 70.9 fL — ABNORMAL LOW (ref 80.0–100.0)
MONO ABS: 1 10*3/uL — AB (ref 0.2–0.9)
Monocytes Relative: 9 %
Neutro Abs: 5.2 10*3/uL (ref 1.4–6.5)
Neutrophils Relative %: 49 %
PLATELETS: 378 10*3/uL (ref 150–440)
RBC: 5.76 MIL/uL — ABNORMAL HIGH (ref 3.80–5.20)
RDW: 19.6 % — ABNORMAL HIGH (ref 11.5–14.5)
WBC: 10.8 10*3/uL (ref 3.6–11.0)

## 2018-01-13 LAB — COMPREHENSIVE METABOLIC PANEL
ALK PHOS: 77 U/L (ref 38–126)
ALT: 28 U/L (ref 14–54)
ANION GAP: 13 (ref 5–15)
AST: 24 U/L (ref 15–41)
Albumin: 4.1 g/dL (ref 3.5–5.0)
BUN: 15 mg/dL (ref 6–20)
CALCIUM: 9.4 mg/dL (ref 8.9–10.3)
CO2: 27 mmol/L (ref 22–32)
Chloride: 97 mmol/L — ABNORMAL LOW (ref 101–111)
Creatinine, Ser: 0.77 mg/dL (ref 0.44–1.00)
GFR calc Af Amer: >60 mL/min (ref >60)
GFR calc non Af Amer: >60 mL/min (ref >60)
Glucose, Bld: 136 mg/dL — ABNORMAL HIGH (ref 65–99)
Potassium: 4.1 mmol/L (ref 3.5–5.1)
SODIUM: 137 mmol/L (ref 135–145)
Total Bilirubin: 0.4 mg/dL (ref 0.3–1.2)
Total Protein: 7.3 g/dL (ref 6.5–8.1)

## 2018-01-13 LAB — IRON AND TIBC
IRON: 42 ug/dL (ref 28–170)
SATURATION RATIOS: 10 % — AB (ref 10.4–31.8)
TIBC: 411 ug/dL (ref 250–450)
UIBC: 369 ug/dL

## 2018-01-13 LAB — FERRITIN: Ferritin: 11 ng/mL (ref 11–307)

## 2018-01-13 NOTE — Progress Notes (Signed)
Hematology/Oncology Consult note [facility] [phone] [phone]   Patient Care Team: Kathrine Haddock, NP as PCP - General (Nurse Practitioner)  REFERRING PROVIDER: Nilsa Nutting CHIEF COMPLAINTS/PURPOSE OF CONSULTATION:  Evaluation of neuroendocrine carcinoma  HISTORY OF PRESENTING ILLNESS:  Eileen Ritter is a  52 y.o.  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 Patient was referred to see gastroenterologist Dr. Vicente Males for evaluation of iron deficiency anemia.  Patient underwent EGD and a colonoscopy on September 23, 2017.  Colonoscopy revealed tubular adenoma x1 status post resection.  EGD showed polypoid mass seen in the duodenum.  No biopsy was taken patient was referred to [facility] for EMR versus ESD. Patient underwent EUS at [facility] on December 01, 2017.  The duodenum polyp/mass was submucosal on EUS, about 1 cm.  A band was placed at the base of the polyp, biopsy was taken from the polyp which demonstrated well-differentiated grade 1 neuroendocrine carcinoma.  Patient was told that the mass is expected to drop off and she has planned for repeat EGD at [facility] in about 6 months. Patient takes ferrous sulfate slow release 45 mg daily.  She also takes B12 100 mcg by mouth daily. Today she feels that fatigue has getting better after starting iron supplementation. She has intermittent hot flushes which she attributes to going through menopausal.  She denies any diarrhea or abdominal pain..   Patient is perimenopausal with heavy menstrual bleeding history, and endograft regular cycles lately.  Last period was during last December.   Review of Systems  Constitutional: Negative for fever.  HENT: Negative for nosebleeds.   Eyes: Negative for pain.  Respiratory: Negative for cough, sputum production and shortness of breath.   Cardiovascular: Negative for chest pain.  Gastrointestinal:  Negative for abdominal pain, blood in stool, nausea and vomiting.  Genitourinary: Negative for dysuria.  Musculoskeletal: Negative for myalgias.  Skin: Negative for rash.  Neurological: Negative for dizziness.  Endo/Heme/Allergies: Does not bruise/bleed easily.  Psychiatric/Behavioral: Negative for depression and hallucinations.    MEDICAL HISTORY:  Past Medical History:  Diagnosis Date  . Allergic rhinitis   . Anxiety   . Chronic kidney disease   . Depression   . Diabetes ([place])   . GERD (gastroesophageal reflux disease)   . HBP (high blood pressure)   . High cholesterol   . Obesity     SURGICAL HISTORY: Past Surgical History:  Procedure Laterality Date  . APPENDECTOMY    . COLONOSCOPY WITH PROPOFOL N/A 09/23/2017   Procedure: COLONOSCOPY WITH PROPOFOL;  Surgeon: Jonathon Bellows, MD;  Location: [facility] ENDOSCOPY;  Service: Gastroenterology;  Laterality: N/A;  . CYST EXCISION     back  . ESOPHAGOGASTRODUODENOSCOPY (EGD) WITH PROPOFOL N/A 09/23/2017   Procedure: ESOPHAGOGASTRODUODENOSCOPY (EGD) WITH PROPOFOL;  Surgeon: Jonathon Bellows, MD;  Location: [facility] ENDOSCOPY;  Service: Gastroenterology;  Laterality: N/A;  . TONSILLECTOMY    . TUBAL LIGATION      SOCIAL HISTORY: Social History   Socioeconomic History  . Marital status: Divorced    Spouse name: Not on file  . Number of children: Not on file  . Years of education: Not on file  . Highest education level: Not on file  Social Needs  . Financial resource strain: Not on file  . Food insecurity - worry: Not on file  . Food insecurity - inability: Not on file  . Transportation needs - medical: Not on file  . Transportation  needs - non-medical: Not on file  Occupational History  . Not on file  Tobacco Use  . Smoking status: Former Smoker    Last attempt to quit: 11/01/2013    Years since quitting: 4.2  . Smokeless tobacco: Never Used  Substance and Sexual Activity  . Alcohol use: No  . Drug use: No  . Sexual activity: Yes   Other Topics Concern  . Not on file  Social History Narrative  . Not on file    FAMILY HISTORY: Family History  Problem Relation Age of Onset  . Diabetes Mother   . Heart disease Mother   . Hypertension Mother   . Stroke Mother   . Cancer Mother        LUNG  . Mental illness Mother   . Thyroid disease Mother   . Diabetes Father   . Heart disease Father   . Diabetes Sister   . Diabetes Brother   . Heart disease Brother   . Heart disease Maternal Grandmother   . Breast cancer Neg Hx     ALLERGIES:  has No Known Allergies.  MEDICATIONS:  Current Outpatient Medications  Medication Sig Dispense Refill  . Ascorbic Acid (VITAMIN C) 100 MG tablet Take 100 mg by mouth daily.    Marland Kitchen atorvastatin (LIPITOR) 40 MG tablet TAKE 1 TABLET (40 MG TOTAL) BY MOUTH DAILY. 90 tablet 1  . buPROPion (WELLBUTRIN SR) 150 MG 12 hr tablet TAKE 1 TABLET (150 MG TOTAL) BY MOUTH DAILY. 180 tablet 1  . Cholecalciferol (VITAMIN D) 2000 units tablet Take 2,000 Units by mouth daily.    . Ferrous Sulfate Dried (SLOW RELEASE IRON) 45 MG TBCR Take 45 mg by mouth daily.    . Fluoxetine HCl, PMDD, 20 MG TABS TAKE 1 TABLET BY MOUTH EVERYDAY AT BEDTIME  1  . fluticasone (FLONASE) 50 MCG/ACT nasal spray Place 2 sprays into both nostrils daily.    . INVOKANA 300 MG TABS tablet TAKE 1 TABLET (300 MG TOTAL) BY MOUTH DAILY BEFORE BREAKFAST. 90 tablet 3  . losartan (COZAAR) 50 MG tablet TAKE 1 TABLET BY MOUTH EVERY DAY 90 tablet 0  . metFORMIN (GLUCOPHAGE) 500 MG tablet TAKE 2 TABLETS (1,000 MG TOTAL) BY MOUTH 2 (TWO) TIMES DAILY WITH A MEAL. 240 tablet 1  . omeprazole (PRILOSEC) 20 MG capsule TAKE 1 CAPSULE BY MOUTH EVERY DAY 90 capsule 1  . Probiotic Product (PROBIOTIC PO) Take 2 tablets by mouth daily.    . vitamin B-12 (CYANOCOBALAMIN) 100 MCG tablet Take 100 mcg by mouth daily.    . cyclobenzaprine (FLEXERIL) 10 MG tablet Take 1 tablet (10 mg total) by mouth as needed for muscle spasms. (Patient not taking:  Reported on 01/13/2018) 90 tablet 1   No current facility-administered medications for this visit.      PHYSICAL EXAMINATION: ECOG PERFORMANCE STATUS: 0 - Asymptomatic Vitals:   01/13/18 1341  BP: 137/80  Pulse: 84  Temp: 98.5 F (36.9 C)   Filed Weights   01/13/18 1341  Weight: 285 lb 8 oz (129.5 kg)    Physical Exam  Constitutional: She is well-developed, well-nourished, and in no distress. No distress.  Morbidly obese  HENT:  Head: Normocephalic and atraumatic.  Eyes: EOM are normal. Pupils are equal, round, and reactive to light.  Neck: Normal range of motion. Neck supple. No JVD present.  Cardiovascular: Normal rate and regular rhythm.  No murmur heard. Pulmonary/Chest: Effort normal and breath sounds normal. She has no wheezes.  Abdominal:  Soft. Bowel sounds are normal. She exhibits no distension.  Musculoskeletal: Normal range of motion. She exhibits no edema.  Neurological: She is alert.  Skin: Skin is warm and dry.  Psychiatric: Affect normal.     LABORATORY DATA:  I have reviewed the data as listed Lab Results  Component Value Date   WBC 10.8 01/13/2018   HGB 12.8 01/13/2018   HCT 40.9 01/13/2018   MCV 70.9 (L) 01/13/2018   PLT 378 01/13/2018   Recent Labs    07/27/17 0828 10/27/17 0849 01/13/18 1425  NA 139 135 137  K 4.7 5.3* 4.1  CL 101 99 97*  CO2 22 23 27   GLUCOSE 145* 144* 136*  BUN 13 15 15   CREATININE 0.65 0.69 0.77  CALCIUM 9.6 9.6 9.4  GFRNONAA 105 103 >60  GFRAA 121 118 >60  PROT 6.6 6.6 7.3  ALBUMIN 4.3 4.1 4.1  AST 23 13 24   ALT 30 20 28   ALKPHOS 78 76 77  BILITOT 0.3 0.2 0.4    Pathology A: Small bowel, duodenum, biopsy - Neuroendocrine tumor grade 1 (G1; well differentiated neuroendocrine tumor, carcinoid)  - Tumor size at least 2.5 mm in greatest dimension - Neuroendocrine tumor extends to edges of specimen   ASSESSMENT & PLAN:  1. Iron deficiency anemia due to chronic blood loss   2. Neuroendocrine carcinoma  ([place])    # Repeat CBC test today showed that anemia has significantly improved compared to a few months ago.  She has persistent micro-cytosis  last iron panel revealed a ferritin of 6.  I suggest patient to continue taking iron pills and I will repeat iron panel today.  If still low.  I would recommend IV iron infusion. Iron deficiency anemia etiology, likely from menorrhagia +/- GI blood loss.   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  I would obtain DOTADATE scan  to see if she has disease elsewhere. Check chromogranin A and 5-HIAA.    All questions were answered. The patient knows to call the clinic with any problems questions or concerns.  Return of visit: after scan.  Thank you for this kind referral and the opportunity to participate in the care of this patient. A copy of today's note is routed to referring provider    Earlie Server, MD, PhD Hematology Oncology [facility] Pager- [phone] 01/13/2018

## 2018-01-13 NOTE — Progress Notes (Signed)
Met with Ms. Eileen Ritter and her family during consult with Dr. Tasia Catchings. Introduced nurse navigator services and provided contact information for future needs. Educated further on Gallium PET. Provided further education on NET grade 1. Oncology Nurse Navigator Documentation  Navigator Location: [facility]-Med Onc (01/13/18 1400)   )Navigator Encounter Type: Initial MedOnc (01/13/18 1400)                     Patient Visit Type: MedOnc;Initial (01/13/18 1400)                              Time Spent with Patient: 45 (01/13/18 1400)

## 2018-01-14 LAB — CHROMOGRANIN A: Chromogranin A: 7 nmol/L — ABNORMAL HIGH (ref 0–5)

## 2018-01-17 ENCOUNTER — Other Ambulatory Visit: Payer: Self-pay

## 2018-01-17 DIAGNOSIS — C7A8 Other malignant neuroendocrine tumors: Secondary | ICD-10-CM

## 2018-01-25 ENCOUNTER — Encounter: Payer: Self-pay | Admitting: Oncology

## 2018-01-26 ENCOUNTER — Ambulatory Visit
Admission: RE | Admit: 2018-01-26 | Discharge: 2018-01-26 | Disposition: A | Discharge status: Home / Self Care | Payer: Commercial Managed Care - PPO | Source: Ambulatory Visit | Attending: Oncology | Admitting: Oncology

## 2018-01-26 DIAGNOSIS — C7A8 Other malignant neuroendocrine tumors: Secondary | ICD-10-CM

## 2018-01-26 DIAGNOSIS — I700 Atherosclerosis of aorta: Secondary | ICD-10-CM | POA: Insufficient documentation

## 2018-01-26 DIAGNOSIS — D3501 Benign neoplasm of right adrenal gland: Secondary | ICD-10-CM | POA: Insufficient documentation

## 2018-01-26 LAB — 5 HIAA, QUANTITATIVE, URINE, 24 HOUR
5-HIAA, Ur: 1.6 mg/L
5-HIAA,Quant.,24 Hr Urine: 4.2 mg/24 hr (ref 0.0–14.9)

## 2018-01-26 MED ORDER — GALLIUM GA 68 DOTATATE IV KIT
5.9000 | PACK | Freq: Once | INTRAVENOUS | Status: AC
  Administered 2018-01-26: 5.9 via INTRAVENOUS

## 2018-01-27 ENCOUNTER — Encounter: Payer: Self-pay | Admitting: Oncology

## 2018-01-27 ENCOUNTER — Ambulatory Visit: Payer: Commercial Managed Care - PPO | Admitting: Oncology

## 2018-01-27 ENCOUNTER — Inpatient Hospital Stay: Payer: Commercial Managed Care - PPO | Attending: Oncology | Admitting: Oncology

## 2018-01-27 VITALS — BP 146/83 | HR 88 | Temp 98.1°F | Resp 18 | Ht 62.0 in | Wt 283.8 lb

## 2018-01-27 DIAGNOSIS — C7A010 Malignant carcinoid tumor of the duodenum: Secondary | ICD-10-CM

## 2018-01-27 DIAGNOSIS — D509 Iron deficiency anemia, unspecified: Secondary | ICD-10-CM | POA: Insufficient documentation

## 2018-01-27 DIAGNOSIS — E538 Deficiency of other specified B group vitamins: Secondary | ICD-10-CM | POA: Diagnosis not present

## 2018-01-27 DIAGNOSIS — D3A8 Other benign neuroendocrine tumors: Secondary | ICD-10-CM

## 2018-01-27 DIAGNOSIS — D3501 Benign neoplasm of right adrenal gland: Secondary | ICD-10-CM

## 2018-01-27 NOTE — Progress Notes (Signed)
No new changes noted today 

## 2018-01-28 ENCOUNTER — Other Ambulatory Visit: Payer: Self-pay | Admitting: Unknown Physician Specialty

## 2018-01-28 ENCOUNTER — Ambulatory Visit (INDEPENDENT_AMBULATORY_CARE_PROVIDER_SITE_OTHER): Payer: Commercial Managed Care - PPO | Admitting: Unknown Physician Specialty

## 2018-01-28 ENCOUNTER — Encounter: Payer: Self-pay | Admitting: Unknown Physician Specialty

## 2018-01-28 VITALS — BP 129/82 | HR 71 | Temp 98.1°F | Wt 284.7 lb

## 2018-01-28 DIAGNOSIS — E1122 Type 2 diabetes mellitus with diabetic chronic kidney disease: Secondary | ICD-10-CM

## 2018-01-28 DIAGNOSIS — I10 Essential (primary) hypertension: Secondary | ICD-10-CM | POA: Diagnosis not present

## 2018-01-28 DIAGNOSIS — E782 Mixed hyperlipidemia: Secondary | ICD-10-CM

## 2018-01-28 DIAGNOSIS — J0100 Acute maxillary sinusitis, unspecified: Secondary | ICD-10-CM | POA: Diagnosis not present

## 2018-01-28 LAB — BAYER DCA HB A1C WAIVED: HB A1C (BAYER DCA - WAIVED): 7.5 % — ABNORMAL HIGH (ref <7.0)

## 2018-01-28 MED ORDER — FLUCONAZOLE 150 MG PO TABS: 150.0000 mg | ORAL_TABLET | Freq: Once | ORAL | 0 refills | Status: AC

## 2018-01-28 MED ORDER — AMOXICILLIN 875 MG PO TABS: 875.0000 mg | ORAL_TABLET | Freq: Two times a day (BID) | ORAL | 0 refills | Status: DC

## 2018-01-28 NOTE — Progress Notes (Signed)
BP 129/82   Pulse 71   Temp 98.1 F (36.7 C) (Oral)   Wt 284 lb 11.2 oz (129.1 kg)   SpO2 98%   BMI 52.07 kg/m    Subjective:    Patient ID: Eileen Ritter, female    DOB: [birth year], 52 y.o.   MRN: [id number]  HPI: Eileen Ritter is a 52 y.o. female  Chief Complaint  Patient presents with  . Depression  . Diabetes  . Hyperlipidemia  . Hypertension   Diabetes: Using medications without difficulties No hypoglycemic episodes No hyperglycemic episodes Feet problems: none Blood Sugars averaging: Not checking eye exam within last year Last Hgb A1C: 7.1  Hypertension  Using medications without difficulty Average home BPs not BP   Using medication without problems or lightheadedness No chest pain with exertion or shortness of breath No Edema  Elevated Cholesterol Using medications without problems No Muscle aches  Diet: Doing a lot of stress eating.  Not sleeping Exercise:none  Depression Stable now she is done with medical tests.   Depression screen [facility] 2/9 01/28/2018 10/27/2017 07/27/2017 04/26/2017 10/26/2016  Decreased Interest 0 0 0 0 0  Down, Depressed, Hopeless 0 1 0 0 0  PHQ - 2 Score 0 1 0 0 0  Altered sleeping 0 0 0 - 0  Tired, decreased energy 0 0 0 - 0  Change in appetite 2 1 0 - 0  Feeling bad or failure about yourself  0 0 0 - 0  Trouble concentrating 1 0 0 - 0  Moving slowly or fidgety/restless 0 0 0 - 0  Suicidal thoughts 0 0 0 - 0  PHQ-9 Score 3 2 0 - 0   Sinus Problem  This is a new problem. The current episode started more than 1 month ago (2 weeks). The problem is unchanged. There has been no fever. Associated symptoms include congestion, headaches and sinus pressure. Pertinent negatives include no chills, coughing, diaphoresis, ear pain, hoarse voice, neck pain, shortness of breath, sneezing, sore throat or swollen glands. Treatments tried: triple antibiotic ointment. The treatment provided no relief.      Relevant past medical,  surgical, family and social history reviewed and updated as indicated. Interim medical history since our last visit reviewed. Allergies and medications reviewed and updated.  Review of Systems  Constitutional: Negative for chills and diaphoresis.  HENT: Positive for congestion and sinus pressure. Negative for ear pain, hoarse voice, sneezing and sore throat.   Respiratory: Negative for cough and shortness of breath.   Musculoskeletal: Negative for neck pain.  Neurological: Positive for headaches.    Per HPI unless specifically indicated above     Objective:    BP 129/82   Pulse 71   Temp 98.1 F (36.7 C) (Oral)   Wt 284 lb 11.2 oz (129.1 kg)   SpO2 98%   BMI 52.07 kg/m   Wt Readings from Last 3 Encounters:  01/28/18 284 lb 11.2 oz (129.1 kg)  01/27/18 283 lb 12.8 oz (128.7 kg)  01/13/18 285 lb 8 oz (129.5 kg)    Physical Exam  Constitutional: She is oriented to person, place, and time. She appears well-developed and well-nourished. No distress.  HENT:  Head: Normocephalic and atraumatic.  Right Ear: Tympanic membrane and ear canal normal.  Left Ear: Tympanic membrane and ear canal normal.  Nose: No rhinorrhea. Right sinus exhibits maxillary sinus tenderness. Right sinus exhibits no frontal sinus tenderness. Left sinus exhibits maxillary sinus tenderness. Left sinus exhibits no frontal  sinus tenderness.  Eyes: Conjunctivae and lids are normal. Right eye exhibits no discharge. Left eye exhibits no discharge. No scleral icterus.  Cardiovascular: Normal rate and regular rhythm.  Pulmonary/Chest: Effort normal and breath sounds normal. No respiratory distress.  Abdominal: Normal appearance. There is no splenomegaly or hepatomegaly.  Musculoskeletal: Normal range of motion.  Neurological: She is alert and oriented to person, place, and time.  Skin: Skin is intact. No rash noted. No pallor.  Psychiatric: She has a normal mood and affect. Her behavior is normal. Judgment and  thought content normal.    Results for orders placed or performed in visit on 01/17/18  5 HIAA, quantitative, urine, 24 hour  Result Value Ref Range   5-HIAA, Ur 1.6 Undefined mg/L   5-HIAA,Quant.,24 Hr Urine 4.2 0.0 - 14.9 mg/24 hr      Assessment & Plan:   Problem List Items Addressed This Visit      Unprioritized   Diabetes ([place]) - Primary    Hgb A1C is 7.5%.  Work on diet and exercise as has recently come through a stressful time in her life       Relevant Orders   Comprehensive metabolic panel   Bayer Ruby Hb A1c Waived   HTN (hypertension)    Stable, continue present medications.        Relevant Orders   Comprehensive metabolic panel   Hyperlipidemia    Stable, continue present medications.        Relevant Orders   Lipid Panel w/o Chol/HDL Ratio    Other Visit Diagnoses    Acute non-recurrent maxillary sinusitis       Rx for Amoxil 875 mg BID.     Relevant Medications   amoxicillin (AMOXIL) 875 MG tablet   fluconazole (DIFLUCAN) 150 MG tablet       Follow up plan: Return in about 3 months (around 04/27/2018).

## 2018-01-28 NOTE — Assessment & Plan Note (Signed)
Stable, continue present medications.   

## 2018-01-28 NOTE — Assessment & Plan Note (Signed)
Hgb A1C is 7.5%.  Work on diet and exercise as has recently come through a stressful time in her life

## 2018-01-28 NOTE — Progress Notes (Signed)
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  Patient of Dr. Tasia Catchings and I am covering her in her absence.Patient was referred to see gastroenterologist Dr. Vicente Males for evaluation of iron deficiency anemia.  Patient underwent EGD and a colonoscopy on September 23, 2017.  Colonoscopy revealed tubular adenoma x1 status post resection.  EGD showed polypoid mass seen in the duodenum.  No biopsy was taken patient was referred to [facility] for EMR versus ESD. Patient underwent EUS at [facility] on December 01, 2017.  The duodenum polyp/mass was submucosal on EUS, about 1 cm.  A band was placed at the base of the polyp, biopsy was taken from the polyp which demonstrated well-differentiated grade 1 neuroendocrine carcinoma.  Patient was told that the mass is expected to drop off and she has planned for repeat EGD at [facility] in about 6 months. Patient takes ferrous sulfate slow release 45 mg daily.  She also takes B12 100 mcg by mouth daily.  Gallium PET scan on 01/26/2018 showed:IMPRESSION: 1. No evidence for Ga-68 DOTATATE avid tumor. 2.  Aortic Atherosclerosis (ICD10-I70.0). 3. Right adrenal gland adenoma  Interval history-patient had her EGD at [facility] along with resection of the duodenal neuroendocrine carcinoma.  She was supposed to get an EGD in 6 months time which would be in July.  Patient does not want to go back to the [facility] as she did not have a good experience with them.  She would prefer to go to [facility].  Currently patient is doing well and denies any  symptoms of fatigue or abdominal pain.  Denies any palpitations flushing or diarrhea.  She is currently taking 1 iron tablet a day along with p.o. B12 thousand micrograms daily.  ECOG PS- 0 Pain scale- 0   Review of systems- Review of Systems  Constitutional: Negative for chills, fever, malaise/fatigue and weight loss.  HENT: Negative for congestion, ear discharge and nosebleeds.   Eyes: Negative for blurred vision.  Respiratory: Negative for cough, hemoptysis, sputum production, shortness of breath and wheezing.   Cardiovascular: Negative for chest pain, palpitations, orthopnea and claudication.  Gastrointestinal: Negative for abdominal pain, blood in stool, constipation, diarrhea, heartburn, melena, nausea and vomiting.  Genitourinary: Negative for dysuria, flank pain, frequency, hematuria and urgency.  Musculoskeletal: Negative for back pain, joint pain and myalgias.  Skin: Negative for rash.  Neurological: Negative for dizziness, tingling, focal weakness, seizures, weakness and headaches.  Endo/Heme/Allergies: Does not bruise/bleed easily.  Psychiatric/Behavioral: Negative for depression and suicidal ideas. The patient does not have insomnia.       No Known Allergies   Past Medical History:  Diagnosis Date  . Allergic rhinitis   . Anxiety   . Chronic kidney disease   . Depression   . Diabetes ([place])   . GERD (gastroesophageal reflux disease)   . HBP (high blood pressure)   . High cholesterol   . Iron deficiency anemia due to chronic blood loss 01/13/2018  . Obesity      Past Surgical History:  Procedure Laterality Date  . APPENDECTOMY    . COLONOSCOPY WITH PROPOFOL N/A 09/23/2017  Procedure: COLONOSCOPY WITH PROPOFOL;  Surgeon: Jonathon Bellows, MD;  Location: [facility] ENDOSCOPY;  Service: Gastroenterology;  Laterality: N/A;  . CYST EXCISION     back  . ESOPHAGOGASTRODUODENOSCOPY (EGD) WITH PROPOFOL N/A 09/23/2017   Procedure: ESOPHAGOGASTRODUODENOSCOPY (EGD) WITH PROPOFOL;   Surgeon: Jonathon Bellows, MD;  Location: [facility] ENDOSCOPY;  Service: Gastroenterology;  Laterality: N/A;  . TONSILLECTOMY    . TUBAL LIGATION      Social History   Socioeconomic History  . Marital status: Single    Spouse name: Not on file  . Number of children: Not on file  . Years of education: Not on file  . Highest education level: Not on file  Social Needs  . Financial resource strain: Not on file  . Food insecurity - worry: Not on file  . Food insecurity - inability: Not on file  . Transportation needs - medical: Not on file  . Transportation needs - non-medical: Not on file  Occupational History  . Not on file  Tobacco Use  . Smoking status: Former Smoker    Last attempt to quit: 11/01/2013    Years since quitting: 4.2  . Smokeless tobacco: Never Used  Substance and Sexual Activity  . Alcohol use: No  . Drug use: No  . Sexual activity: Yes  Other Topics Concern  . Not on file  Social History Narrative  . Not on file    Family History  Problem Relation Age of Onset  . Diabetes Mother   . Heart disease Mother   . Hypertension Mother   . Stroke Mother   . Cancer Mother        LUNG  . Mental illness Mother   . Thyroid disease Mother   . Diabetes Father   . Heart disease Father   . Diabetes Sister   . Diabetes Brother   . Heart disease Brother   . Heart disease Maternal Grandmother   . Breast cancer Neg Hx      Current Outpatient Medications:  .  Ascorbic Acid (VITAMIN C) 100 MG tablet, Take 100 mg by mouth daily., Disp: , Rfl:  .  buPROPion (WELLBUTRIN SR) 150 MG 12 hr tablet, TAKE 1 TABLET (150 MG TOTAL) BY MOUTH DAILY., Disp: 180 tablet, Rfl: 1 .  Cholecalciferol (VITAMIN D) 2000 units tablet, Take 2,000 Units by mouth daily., Disp: , Rfl:  .  Ferrous Sulfate Dried (SLOW RELEASE IRON) 45 MG TBCR, Take 45 mg by mouth daily., Disp: , Rfl:  .  Fluoxetine HCl, PMDD, 20 MG TABS, TAKE 1 TABLET BY MOUTH EVERYDAY AT BEDTIME, Disp: , Rfl: 1 .  INVOKANA 300 MG TABS  tablet, TAKE 1 TABLET (300 MG TOTAL) BY MOUTH DAILY BEFORE BREAKFAST., Disp: 90 tablet, Rfl: 3 .  losartan (COZAAR) 50 MG tablet, TAKE 1 TABLET BY MOUTH EVERY DAY, Disp: 90 tablet, Rfl: 0 .  metFORMIN (GLUCOPHAGE) 500 MG tablet, TAKE 2 TABLETS (1,000 MG TOTAL) BY MOUTH 2 (TWO) TIMES DAILY WITH A MEAL., Disp: 240 tablet, Rfl: 1 .  omeprazole (PRILOSEC) 20 MG capsule, TAKE 1 CAPSULE BY MOUTH EVERY DAY, Disp: 90 capsule, Rfl: 1 .  Probiotic Product (PROBIOTIC PO), Take 2 tablets by mouth daily., Disp: , Rfl:  .  vitamin B-12 (CYANOCOBALAMIN) 100 MCG tablet, Take 100 mcg by mouth daily., Disp: , Rfl:  .  atorvastatin (LIPITOR) 40 MG tablet, TAKE 1 TABLET BY MOUTH EVERY DAY, Disp: 90 tablet, Rfl: 1 .  cyclobenzaprine (FLEXERIL) 10 MG tablet, Take 1 tablet (10 mg  total) by mouth as needed for muscle spasms. (Patient not taking: Reported on 01/13/2018), Disp: 90 tablet, Rfl: 1 .  fluticasone (FLONASE) 50 MCG/ACT nasal spray, Place 2 sprays into both nostrils daily., Disp: , Rfl:   Physical exam:  Vitals:   01/27/18 0941  BP: (!) 146/83  Pulse: 88  Resp: 18  Temp: 98.1 F (36.7 C)  TempSrc: Tympanic  Weight: 283 lb 12.8 oz (128.7 kg)  Height: 5\' 2"  (1.575 m)   Physical Exam  Constitutional: She is oriented to person, place, and time.  Patient is obese.  Does not appear to be in any acute distress  HENT:  Head: Normocephalic and atraumatic.  Eyes: EOM are normal. Pupils are equal, round, and reactive to light.  Neck: Normal range of motion.  Cardiovascular: Normal rate, regular rhythm and normal heart sounds.  Pulmonary/Chest: Effort normal and breath sounds normal.  Abdominal: Soft. Bowel sounds are normal.  Neurological: She is alert and oriented to person, place, and time.  Skin: Skin is warm and dry.     CMP Latest Ref Rng & Units 01/13/2018  Glucose 65 - 99 mg/dL 136(H)  BUN 6 - 20 mg/dL 15  Creatinine 0.44 - 1.00 mg/dL 0.77  Sodium 135 - 145 mmol/L 137  Potassium 3.5 - 5.1 mmol/L  4.1  Chloride 101 - 111 mmol/L 97(L)  CO2 22 - 32 mmol/L 27  Calcium 8.9 - 10.3 mg/dL 9.4  Total Protein 6.5 - 8.1 g/dL 7.3  Total Bilirubin 0.3 - 1.2 mg/dL 0.4  Alkaline Phos 38 - 126 U/L 77  AST 15 - 41 U/L 24  ALT 14 - 54 U/L 28   CBC Latest Ref Rng & Units 01/13/2018  WBC 3.6 - 11.0 K/uL 10.8  Hemoglobin 12.0 - 16.0 g/dL 12.8  Hematocrit 35.0 - 47.0 % 40.9  Platelets 150 - 440 K/uL 378    No images are attached to the encounter.  Nm Pet (netspot Ga 68 Dotatate) Skull Base To Mid Thigh  Result Date: 01/27/2018 CLINICAL DATA:  Neuroendocrine tumor of GI tract. EXAM: NUCLEAR MEDICINE PET SKULL BASE TO THIGH TECHNIQUE: 5.9 mCi Ga 76 DOTATATE was injected intravenously. Full-ring PET imaging was performed from the skull base to thigh after the radiotracer. CT data was obtained and used for attenuation correction and anatomic localization. COMPARISON:  CT AP 07/22/2013 FINDINGS: NECK No radiotracer activity in neck lymph nodes. CHEST No radiotracer accumulation within mediastinal or hilar lymph nodes. No suspicious pulmonary nodules on the CT scan. ABDOMEN/PELVIS Physiologic activity noted in the liver, spleen, adrenal glands and kidneys. Aortic atherosclerosis noted. Right adrenal gland adenoma measures 2.2 cm. SKELETON No focal activity to suggest skeletal metastasis. Spondylosis noted within the lumbar spine. IMPRESSION: 1. No evidence for Ga-68 DOTATATE avid tumor. 2.  Aortic Atherosclerosis (ICD10-I70.0). 3. Right adrenal gland adenoma. Electronically Signed   By: Kerby Moors M.D.   On: 01/27/2018 07:30     Assessment and plan- Patient is a 52 y.o. 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  Patient had a 2.5 mm well-differentiated duodenal neuroendocrine carcinoma which was resected.  However the tumor extended to the edges of the specimen.  Plan was to repeat endoscopy in 6 months time which would be in July 2019.  Patient does  not want to go back to [facility] for this and would like to go to [facility] instead.  I will touch base with Dr. Vicente Males regarding this.  Gallium PET scan does  not reveal any evidence of residual tumor or distant metastatic disease.  I do not see any role for adjuvant treatment such as somatostatin analogs at this time.  I will add her case to tumor board next week.  In terms of repeat imaging since this was a very small tumor with a slow growth rate, I would think yearly CT scans would suffice but I would defer this decision to Dr. Tasia Catchings.  Especially since patient is getting a repeat endoscopy in 6 months.  Patient will follow up with Dr. Tasia Catchings in July 2019 with CBC ferritin and iron studies as well as chromogranin A levels (which were mildly elevated at 7 and can be followed).  Patient is asymptomatic from neuroendocrine tumor standpoint.  Urine 5 HIAA level was normal  2.  Iron deficiency anemia: Patient has been taking oral iron once a day since October. Her hemoglobin was normal back in January 2019 but she still had persistent microcytosis and labs did show evidence of iron deficiency.  I have asked her to increase her oral iron intake to twice a day and we will repeat CBC ferritin and iron studies 4 weeks from now.  If she continues to have evidence of iron deficiency, she may potentially benefit from IV iron.  Patient has had EGD and colonoscopy by Dr. Vicente Males but she is yet to have capsule endoscopy and I will touch base with him regarding that as well  3.  B12 deficiency: Currently patient is on oral B12 and this is being managed by her primary care doctor.  4.  Patient also noted to have right adrenal adenoma of 2.2 cm which was not seen on prior CT scan in 2014.  I will refer her to endocrinology for further management of this   Visit Diagnosis 1. Iron deficiency anemia, unspecified iron deficiency anemia type   2. Neuroendocrine tumor   3. Adrenal adenoma, right      Dr. Randa Evens, MD, MPH [facility] at [facility] Pager- [phone] 01/28/2018 8:18 AM

## 2018-01-29 LAB — COMPREHENSIVE METABOLIC PANEL
ALBUMIN: 4.3 g/dL (ref 3.5–5.5)
ALT: 30 IU/L (ref 0–32)
AST: 14 IU/L (ref 0–40)
Albumin/Globulin Ratio: 1.7 (ref 1.2–2.2)
Alkaline Phosphatase: 94 IU/L (ref 39–117)
BUN / CREAT RATIO: 22 (ref 9–23)
BUN: 15 mg/dL (ref 6–24)
CALCIUM: 9.7 mg/dL (ref 8.7–10.2)
CHLORIDE: 97 mmol/L (ref 96–106)
CO2: 21 mmol/L (ref 20–29)
Creatinine, Ser: 0.69 mg/dL (ref 0.57–1.00)
GFR calc non Af Amer: 103 mL/min/{1.73_m2} (ref >59)
GFR, EST AFRICAN AMERICAN: 118 mL/min/{1.73_m2} (ref >59)
GLUCOSE: 165 mg/dL — AB (ref 65–99)
Globulin, Total: 2.6 g/dL (ref 1.5–4.5)
Potassium: 4.7 mmol/L (ref 3.5–5.2)
Sodium: 139 mmol/L (ref 134–144)
TOTAL PROTEIN: 6.9 g/dL (ref 6.0–8.5)

## 2018-01-29 LAB — LIPID PANEL W/O CHOL/HDL RATIO
CHOLESTEROL TOTAL: 166 mg/dL (ref 100–199)
HDL: 51 mg/dL (ref >39)
LDL CALC: 95 mg/dL (ref 0–99)
Triglycerides: 98 mg/dL (ref 0–149)
VLDL Cholesterol Cal: 20 mg/dL (ref 5–40)

## 2018-01-31 ENCOUNTER — Encounter: Payer: Self-pay | Admitting: Unknown Physician Specialty

## 2018-01-31 ENCOUNTER — Ambulatory Visit: Payer: Commercial Managed Care - PPO | Admitting: Oncology

## 2018-01-31 NOTE — Progress Notes (Signed)
Notified pt by mychart

## 2018-02-07 ENCOUNTER — Telehealth: Payer: Self-pay | Admitting: *Deleted

## 2018-02-07 NOTE — Telephone Encounter (Signed)
Please see note from [facility]. FYI

## 2018-02-07 NOTE — Telephone Encounter (Signed)
Spoke to [facility] endocrinology via telephone to check on patient referral/appointment. Patient was contacted on 01/31/18 to make endocrinology appointment and patient declined to make appointment at this time.       dhs

## 2018-02-14 ENCOUNTER — Encounter: Payer: Self-pay | Admitting: Gastroenterology

## 2018-02-14 ENCOUNTER — Ambulatory Visit (INDEPENDENT_AMBULATORY_CARE_PROVIDER_SITE_OTHER): Payer: Commercial Managed Care - PPO | Admitting: Gastroenterology

## 2018-02-14 VITALS — BP 126/80 | HR 84 | Temp 97.7°F | Ht 62.0 in | Wt 287.8 lb

## 2018-02-14 DIAGNOSIS — D3A019 Benign carcinoid tumor of the small intestine, unspecified portion: Secondary | ICD-10-CM | POA: Diagnosis not present

## 2018-02-14 DIAGNOSIS — D509 Iron deficiency anemia, unspecified: Secondary | ICD-10-CM | POA: Diagnosis not present

## 2018-02-14 NOTE — Progress Notes (Signed)
Jonathon Bellows MD, MRCP(U.K) [address]  [place]  [place], [place] [address]  Main: [phone]  Fax: [phone]   Primary Care Physician: Kathrine Haddock, NP  Primary Gastroenterologist:  Dr. Jonathon Bellows   No chief complaint on file.   HPI: Eileen Ritter is a 52 y.o. female\  Summary of history :  She was seen initially in 09/2017 when referred for iron deficiency anemia, Labs 07/2017 Hb 11 with MCV 66liver function tests's were normal. No older CBC to compare with. Mother possibly had colon polyps. She used to " a lot of ibuprofen" every day for months, stopped last few months back.She used to take it for her knee.  Labs 09/2017 showed low iron , b12, urine shows no blood. Celiac serology negative.  09/23/17: Colonoscopy tubular adenoma x1 resected.   09/23/17: EGD polypoid mass seen in the duodenum :no biopsies taken: referred to [facility] for EMR vs ESD EUS at [facility] 12/01/17: the duodenal polyp was submucosal on EUS 1 cm , a band was placed at the base of the polyp, biopsies were taken from the polyp which demonstrated NET, grade 1 well deffrientiated. Unclear if the polyp was resected after the band was placed or expected to drop off . Plan from [facility] is for repeat EGD in 6 months.   On iron replacement therapy and b12 therapy  Interval history  01/02/2017 -02/14/18   Doing well , normal bowel movements, no nsaids. Not interested in the capsule study of the small bowel at this time.    Current Outpatient Medications  Medication Sig Dispense Refill  . amoxicillin (AMOXIL) 875 MG tablet Take 1 tablet (875 mg total) by mouth 2 (two) times daily. 20 tablet 0  . Ascorbic Acid (VITAMIN C) 100 MG tablet Take 100 mg by mouth daily.    Marland Kitchen atorvastatin (LIPITOR) 40 MG tablet TAKE 1 TABLET BY MOUTH EVERY DAY 90 tablet 1  . buPROPion (WELLBUTRIN SR) 150 MG 12 hr tablet TAKE 1 TABLET (150 MG TOTAL) BY MOUTH DAILY. 180 tablet 1  . Cholecalciferol (VITAMIN D) 2000 units tablet Take  2,000 Units by mouth daily.    . cyclobenzaprine (FLEXERIL) 10 MG tablet Take 1 tablet (10 mg total) by mouth as needed for muscle spasms. 90 tablet 1  . Ferrous Sulfate Dried (SLOW RELEASE IRON) 45 MG TBCR Take 45 mg by mouth daily.    . fluconazole (DIFLUCAN) 150 MG tablet TAKE 1 TABLET BY MOUTH AS ONE DOSE  0  . Fluoxetine HCl, PMDD, 20 MG TABS TAKE 1 TABLET BY MOUTH EVERYDAY AT BEDTIME  1  . fluticasone (FLONASE) 50 MCG/ACT nasal spray Place 2 sprays into both nostrils daily.    . INVOKANA 300 MG TABS tablet TAKE 1 TABLET (300 MG TOTAL) BY MOUTH DAILY BEFORE BREAKFAST. 90 tablet 3  . losartan (COZAAR) 50 MG tablet TAKE 1 TABLET BY MOUTH EVERY DAY 90 tablet 0  . metFORMIN (GLUCOPHAGE) 500 MG tablet TAKE 2 TABLETS (1,000 MG TOTAL) BY MOUTH 2 (TWO) TIMES DAILY WITH A MEAL. 240 tablet 1  . omeprazole (PRILOSEC) 20 MG capsule TAKE 1 CAPSULE BY MOUTH EVERY DAY 90 capsule 1  . Probiotic Product (PROBIOTIC PO) Take 2 tablets by mouth daily.    . vitamin B-12 (CYANOCOBALAMIN) 100 MCG tablet Take 100 mcg by mouth daily.     No current facility-administered medications for this visit.     Allergies as of 02/14/2018  . (No Known Allergies)    ROS:  General: Negative for anorexia, weight loss, fever, chills, fatigue, weakness. ENT: Negative for hoarseness, difficulty swallowing , nasal congestion. CV: Negative for chest pain, angina, palpitations, dyspnea on exertion, peripheral edema.  Respiratory: Negative for dyspnea at rest, dyspnea on exertion, cough, sputum, wheezing.  GI: See history of present illness. GU:  Negative for dysuria, hematuria, urinary incontinence, urinary frequency, nocturnal urination.  Endo: Negative for unusual weight change.    Physical Examination:   There were no vitals taken for this visit.  General: Well-nourished, well-developed in no acute distress.  Eyes: No icterus. Conjunctivae pink. Mouth: Oropharyngeal mucosa moist and pink , no lesions erythema or  exudate. Lungs: Clear to auscultation bilaterally. Non-labored. Heart: Regular rate and rhythm, no murmurs rubs or gallops.  Abdomen: Bowel sounds are normal, nontender, nondistended, no hepatosplenomegaly or masses, no abdominal bruits or hernia , no rebound or guarding.   Extremities: No lower extremity edema. No clubbing or deformities. Neuro: Alert and oriented x 3.  Grossly intact. Skin: Warm and dry, no jaundice.   Psych: Alert and cooperative, normal mood and affect.   Imaging Studies: Nm Pet (netspot Ga 68 Dotatate) Skull Base To Mid Thigh  Result Date: 01/27/2018 CLINICAL DATA:  Neuroendocrine tumor of GI tract. EXAM: NUCLEAR MEDICINE PET SKULL BASE TO THIGH TECHNIQUE: 5.9 mCi Ga 64 DOTATATE was injected intravenously. Full-ring PET imaging was performed from the skull base to thigh after the radiotracer. CT data was obtained and used for attenuation correction and anatomic localization. COMPARISON:  CT AP 07/22/2013 FINDINGS: NECK No radiotracer activity in neck lymph nodes. CHEST No radiotracer accumulation within mediastinal or hilar lymph nodes. No suspicious pulmonary nodules on the CT scan. ABDOMEN/PELVIS Physiologic activity noted in the liver, spleen, adrenal glands and kidneys. Aortic atherosclerosis noted. Right adrenal gland adenoma measures 2.2 cm. SKELETON No focal activity to suggest skeletal metastasis. Spondylosis noted within the lumbar spine. IMPRESSION: 1. No evidence for Ga-68 DOTATATE avid tumor. 2.  Aortic Atherosclerosis (ICD10-I70.0). 3. Right adrenal gland adenoma. Electronically Signed   By: Kerby Moors M.D.   On: 01/27/2018 07:30    Assessment and Plan:   Eileen Ritter is a 52 y.o. y/o female  here to follow up for iron deficiency anemia. EGD+colonoscopy revealed no source for the anemia but during EGD noted a duodenal polyp which was referred to [facility] for excision .They performed an EUS Sample bx was taken and a ligation band placed at the base- expect the  polyp to drop off.Pathology shows  duodenal grade 1 carcinoid- unclear if resected completely or not .On IV iron and B 12 shots.Likely anemia due to long term NSAID use   Plan  1. Not interested in a capsule study of the small bowel at this time, she will tell me if she changes her mind .  2. 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 Repeat EGD in 05/2018 to check resection site for residual polyp    I have discussed alternative options, risks & benefits,  which include, but are not limited to, bleeding, infection, perforation,respiratory complication & drug reaction.  The patient agrees with this plan & written consent will be obtained.     Dr Jonathon Bellows  MD,MRCP [facility]) Follow up in 7 months

## 2018-02-14 NOTE — Addendum Note (Signed)
Addended by: Peggye Ley on: 02/14/2018 03:04 PM   Modules accepted: Orders, SmartSet

## 2018-02-23 ENCOUNTER — Ambulatory Visit: Payer: Commercial Managed Care - PPO | Admitting: Oncology

## 2018-02-23 ENCOUNTER — Inpatient Hospital Stay: Payer: Commercial Managed Care - PPO | Attending: Oncology

## 2018-02-24 ENCOUNTER — Other Ambulatory Visit: Payer: Commercial Managed Care - PPO

## 2018-03-12 ENCOUNTER — Other Ambulatory Visit: Payer: Self-pay | Admitting: Unknown Physician Specialty

## 2018-03-25 ENCOUNTER — Other Ambulatory Visit: Payer: Self-pay | Admitting: Unknown Physician Specialty

## 2018-05-03 ENCOUNTER — Encounter: Payer: Self-pay | Admitting: Unknown Physician Specialty

## 2018-05-03 ENCOUNTER — Ambulatory Visit: Payer: Commercial Managed Care - PPO | Admitting: Unknown Physician Specialty

## 2018-05-03 VITALS — BP 132/82 | HR 76 | Temp 97.8°F | Ht 62.0 in | Wt 291.4 lb

## 2018-05-03 DIAGNOSIS — I10 Essential (primary) hypertension: Secondary | ICD-10-CM | POA: Diagnosis not present

## 2018-05-03 DIAGNOSIS — E782 Mixed hyperlipidemia: Secondary | ICD-10-CM | POA: Diagnosis not present

## 2018-05-03 DIAGNOSIS — N183 Chronic kidney disease, stage 3 (moderate): Secondary | ICD-10-CM

## 2018-05-03 DIAGNOSIS — E1165 Type 2 diabetes mellitus with hyperglycemia: Secondary | ICD-10-CM

## 2018-05-03 DIAGNOSIS — E1122 Type 2 diabetes mellitus with diabetic chronic kidney disease: Secondary | ICD-10-CM

## 2018-05-03 DIAGNOSIS — C7A8 Other malignant neuroendocrine tumors: Secondary | ICD-10-CM | POA: Diagnosis not present

## 2018-05-03 DIAGNOSIS — Z114 Encounter for screening for human immunodeficiency virus [HIV]: Secondary | ICD-10-CM | POA: Diagnosis not present

## 2018-05-03 LAB — BAYER DCA HB A1C WAIVED: HB A1C: 8.5 % — AB (ref <7.0)

## 2018-05-03 MED ORDER — SEMAGLUTIDE (1 MG/DOSE) 2 MG/1.5ML ~~LOC~~ SOPN: 0.5000 mg | PEN_INJECTOR | SUBCUTANEOUS | 0 refills | Status: DC

## 2018-05-03 MED ORDER — DICLOFENAC SODIUM 1 % TD GEL: 4.0000 g | Freq: Four times a day (QID) | TRANSDERMAL | 12 refills | Status: DC

## 2018-05-03 NOTE — Assessment & Plan Note (Signed)
Stable, continue present medications.   

## 2018-05-03 NOTE — Patient Instructions (Signed)
Start Ozempic .25mg  first dose given in office.  OK to titrate up to .5mg  after 2 weeks.

## 2018-05-03 NOTE — Assessment & Plan Note (Signed)
Seeing Hematology and GI.

## 2018-05-03 NOTE — Progress Notes (Signed)
BP 132/82   Pulse 76   Temp 97.8 F (36.6 C) (Oral)   Ht 5\' 2"  (1.575 m)   Wt 291 lb 6.4 oz (132.2 kg)   LMP 12/01/2017 (Exact Date)   SpO2 97%   BMI 53.30 kg/m    Subjective:    Patient ID: Eileen Ritter, female    DOB: 12/23/1965, 52 y.o.   MRN: [id number]  HPI: Eileen Ritter is a 52 y.o. female  Chief Complaint  Patient presents with  . Depression  . Diabetes  . Hyperlipidemia  . Hypertension   Diabetes: Using medications without difficulties No hypoglycemic episodes No hyperglycemic episodes Feet problems: None Blood Sugars averaging:190 in the AM eye exam within last year Last Hgb A1C: 7.5%  Hypertension  Using medications without difficulty.   Average home BPs   Using medication without problems or lightheadedness No chest pain with exertion or shortness of breath No Edema  Elevated Cholesterol Using medications without problems No Muscle aches  Diet is better.  Exercising is improved with more walking  Pain Pain primarily in knee and other joints are bothering her.  Takes occasional Ibuprofen but reluctant due to stomach cancer.    Depression screen [facility] 2/9 05/03/2018 01/28/2018 10/27/2017 07/27/2017 04/26/2017  Decreased Interest 0 0 0 0 0  Down, Depressed, Hopeless 0 0 1 0 0  PHQ - 2 Score 0 0 1 0 0  Altered sleeping 0 0 0 0 -  Tired, decreased energy 0 0 0 0 -  Change in appetite 1 2 1  0 -  Feeling bad or failure about yourself  0 0 0 0 -  Trouble concentrating 0 1 0 0 -  Moving slowly or fidgety/restless 0 0 0 0 -  Suicidal thoughts 0 0 0 0 -  PHQ-9 Score 1 3 2  0 -     Relevant past medical, surgical, family and social history reviewed and updated as indicated. Interim medical history since our last visit reviewed. Allergies and medications reviewed and updated.  Review of Systems  Constitutional: Negative.   HENT: Negative.   Eyes: Negative.   Respiratory: Negative.   Cardiovascular: Negative.   Gastrointestinal:  Negative.   Endocrine: Negative.   Genitourinary: Negative.   Musculoskeletal: Negative.   Skin: Negative.   Allergic/Immunologic: Negative.   Neurological: Negative.   Hematological: Negative.   Psychiatric/Behavioral: Negative.     Per HPI unless specifically indicated above     Objective:    BP 132/82   Pulse 76   Temp 97.8 F (36.6 C) (Oral)   Ht 5\' 2"  (1.575 m)   Wt 291 lb 6.4 oz (132.2 kg)   LMP 12/01/2017 (Exact Date)   SpO2 97%   BMI 53.30 kg/m   Wt Readings from Last 3 Encounters:  05/03/18 291 lb 6.4 oz (132.2 kg)  02/14/18 287 lb 12.8 oz (130.5 kg)  01/28/18 284 lb 11.2 oz (129.1 kg)    Physical Exam  Constitutional: She is oriented to person, place, and time. She appears well-developed and well-nourished. No distress.  HENT:  Head: Normocephalic and atraumatic.  Eyes: Conjunctivae and lids are normal. Right eye exhibits no discharge. Left eye exhibits no discharge. No scleral icterus.  Neck: Normal range of motion. Neck supple. No JVD present. Carotid bruit is not present.  Cardiovascular: Normal rate, regular rhythm and normal heart sounds.  Pulmonary/Chest: Effort normal and breath sounds normal.  Abdominal: Normal appearance. There is no splenomegaly or hepatomegaly.  Musculoskeletal: Normal range of  motion.  Neurological: She is alert and oriented to person, place, and time.  Skin: Skin is warm, dry and intact. No rash noted. No pallor.  Psychiatric: She has a normal mood and affect. Her behavior is normal. Judgment and thought content normal.    Results for orders placed or performed in visit on 01/28/18  Comprehensive metabolic panel  Result Value Ref Range   Glucose 165 (H) 65 - 99 mg/dL   BUN 15 6 - 24 mg/dL   Creatinine, Ser 0.69 0.57 - 1.00 mg/dL   GFR calc non Af Amer 103 >59 mL/min/1.73   GFR calc Af Amer 118 >59 mL/min/1.73   BUN/Creatinine Ratio 22 9 - 23   Sodium 139 134 - 144 mmol/L   Potassium 4.7 3.5 - 5.2 mmol/L   Chloride 97 96  - 106 mmol/L   CO2 21 20 - 29 mmol/L   Calcium 9.7 8.7 - 10.2 mg/dL   Total Protein 6.9 6.0 - 8.5 g/dL   Albumin 4.3 3.5 - 5.5 g/dL   Globulin, Total 2.6 1.5 - 4.5 g/dL   Albumin/Globulin Ratio 1.7 1.2 - 2.2   Bilirubin Total <0.2 0.0 - 1.2 mg/dL   Alkaline Phosphatase 94 39 - 117 IU/L   AST 14 0 - 40 IU/L   ALT 30 0 - 32 IU/L  Lipid Panel w/o Chol/HDL Ratio  Result Value Ref Range   Cholesterol, Total 166 100 - 199 mg/dL   Triglycerides 98 0 - 149 mg/dL   HDL 51 >39 mg/dL   VLDL Cholesterol Cal 20 5 - 40 mg/dL   LDL Calculated 95 0 - 99 mg/dL  Bayer DCA Hb A1c Waived  Result Value Ref Range   Bayer DCA Hb A1c Waived 7.5 (H) <7.0 %      Assessment & Plan:   Problem List Items Addressed This Visit      Unprioritized   Diabetes ([place]) - Primary   Relevant Medications   Semaglutide (OZEMPIC) 1 MG/DOSE SOPN   Other Relevant Orders   Comprehensive metabolic panel   Bayer DCA Hb A1c Waived   HTN (hypertension)    Stable, continue present medications.        Hyperlipidemia    Stable, continue present medications.        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        Uncontrolled type 2 diabetes mellitus (HCC)    Hgb A1C is 8.5%.  Pt is very compliant with medications.  Tried Bydureon in the past but cost untenable.  Start Ozempic .25mg  first dose given in office.  OK to titrate up to .5mg  after 2 weeks.        Relevant Medications   Semaglutide (OZEMPIC) 1 MG/DOSE SOPN    Other Visit Diagnoses    Screening for HIV without presence of risk factors       Relevant Orders   HIV antibody       Follow up plan: Return in about 6 weeks (around 06/14/2018), or if symptoms worsen or fail to improve.

## 2018-05-03 NOTE — Assessment & Plan Note (Addendum)
Hgb A1C is 8.5%.  Pt is very compliant with medications.  Tried Bydureon in the past but cost untenable.  Start Ozempic .25mg  first dose given in office.  OK to titrate up to .5mg  after 2 weeks.

## 2018-05-04 LAB — COMPREHENSIVE METABOLIC PANEL
A/G RATIO: 2.3 — AB (ref 1.2–2.2)
ALT: 33 IU/L — AB (ref 0–32)
AST: 21 IU/L (ref 0–40)
Albumin: 4.3 g/dL (ref 3.5–5.5)
Alkaline Phosphatase: 88 IU/L (ref 39–117)
BILIRUBIN TOTAL: 0.3 mg/dL (ref 0.0–1.2)
BUN/Creatinine Ratio: 28 — ABNORMAL HIGH (ref 9–23)
BUN: 16 mg/dL (ref 6–24)
CALCIUM: 9.4 mg/dL (ref 8.7–10.2)
CHLORIDE: 102 mmol/L (ref 96–106)
CO2: 20 mmol/L (ref 20–29)
Creatinine, Ser: 0.57 mg/dL (ref 0.57–1.00)
GFR, EST AFRICAN AMERICAN: 125 mL/min/{1.73_m2} (ref >59)
GFR, EST NON AFRICAN AMERICAN: 108 mL/min/{1.73_m2} (ref >59)
Globulin, Total: 1.9 g/dL (ref 1.5–4.5)
Glucose: 168 mg/dL — ABNORMAL HIGH (ref 65–99)
POTASSIUM: 4.7 mmol/L (ref 3.5–5.2)
Sodium: 140 mmol/L (ref 134–144)
Total Protein: 6.2 g/dL (ref 6.0–8.5)

## 2018-05-04 LAB — HIV ANTIBODY (ROUTINE TESTING W REFLEX): HIV Screen 4th Generation wRfx: NONREACTIVE

## 2018-05-04 NOTE — Progress Notes (Signed)
Notified pt by mychart

## 2018-06-01 ENCOUNTER — Encounter: Payer: Self-pay | Admitting: Unknown Physician Specialty

## 2018-06-01 MED ORDER — SEMAGLUTIDE(0.25 OR 0.5MG/DOS) 2 MG/1.5ML ~~LOC~~ SOPN: 0.5000 mg | PEN_INJECTOR | SUBCUTANEOUS | 5 refills | Status: DC

## 2018-06-04 ENCOUNTER — Other Ambulatory Visit: Payer: Self-pay | Admitting: Unknown Physician Specialty

## 2018-06-07 NOTE — Telephone Encounter (Signed)
Review for refill : fluoxetine HCL 20 mg tab...  Historical provider prescribed this med. Last refilled on 03/09/18 for #90 tabs.  Losartan potassium 50 mg tab. last refilled on 03/14/18 for #90 tabs Provider Kathrine Haddock, NP  [facility] [phone] [place]  05/03/18 NOV  06/17/18

## 2018-06-13 ENCOUNTER — Ambulatory Visit: Payer: Commercial Managed Care - PPO | Admitting: Certified Registered"

## 2018-06-13 ENCOUNTER — Ambulatory Visit
Admission: RE | Admit: 2018-06-13 | Discharge: 2018-06-13 | Disposition: A | Discharge status: Home / Self Care | Payer: Commercial Managed Care - PPO | Source: Ambulatory Visit | Attending: Gastroenterology | Admitting: Gastroenterology

## 2018-06-13 ENCOUNTER — Encounter
Admission: RE | Disposition: A | Discharge status: Home / Self Care | Payer: Self-pay | Source: Ambulatory Visit | Attending: Gastroenterology

## 2018-06-13 ENCOUNTER — Encounter: Payer: Self-pay | Admitting: *Deleted

## 2018-06-13 ENCOUNTER — Other Ambulatory Visit: Payer: Self-pay

## 2018-06-13 DIAGNOSIS — Z79899 Other long term (current) drug therapy: Secondary | ICD-10-CM | POA: Diagnosis not present

## 2018-06-13 DIAGNOSIS — Z8509 Personal history of malignant neoplasm of other digestive organs: Secondary | ICD-10-CM | POA: Diagnosis not present

## 2018-06-13 DIAGNOSIS — K219 Gastro-esophageal reflux disease without esophagitis: Secondary | ICD-10-CM | POA: Insufficient documentation

## 2018-06-13 DIAGNOSIS — I129 Hypertensive chronic kidney disease with stage 1 through stage 4 chronic kidney disease, or unspecified chronic kidney disease: Secondary | ICD-10-CM | POA: Insufficient documentation

## 2018-06-13 DIAGNOSIS — Z7984 Long term (current) use of oral hypoglycemic drugs: Secondary | ICD-10-CM | POA: Insufficient documentation

## 2018-06-13 DIAGNOSIS — N189 Chronic kidney disease, unspecified: Secondary | ICD-10-CM | POA: Insufficient documentation

## 2018-06-13 DIAGNOSIS — D509 Iron deficiency anemia, unspecified: Secondary | ICD-10-CM

## 2018-06-13 DIAGNOSIS — K317 Polyp of stomach and duodenum: Secondary | ICD-10-CM | POA: Diagnosis present

## 2018-06-13 DIAGNOSIS — Z87891 Personal history of nicotine dependence: Secondary | ICD-10-CM | POA: Diagnosis not present

## 2018-06-13 DIAGNOSIS — E1122 Type 2 diabetes mellitus with diabetic chronic kidney disease: Secondary | ICD-10-CM | POA: Diagnosis not present

## 2018-06-13 DIAGNOSIS — K3189 Other diseases of stomach and duodenum: Secondary | ICD-10-CM

## 2018-06-13 HISTORY — PX: ESOPHAGOGASTRODUODENOSCOPY (EGD) WITH PROPOFOL: SHX5813

## 2018-06-13 LAB — GLUCOSE, CAPILLARY: GLUCOSE-CAPILLARY: 148 mg/dL — AB (ref 70–99)

## 2018-06-13 LAB — POCT PREGNANCY, URINE: Preg Test, Ur: NEGATIVE

## 2018-06-13 SURGERY — ESOPHAGOGASTRODUODENOSCOPY (EGD) WITH PROPOFOL
Anesthesia: General

## 2018-06-13 MED ORDER — PROPOFOL 10 MG/ML IV BOLUS
INTRAVENOUS | Status: AC
  Filled 2018-06-13: qty 40

## 2018-06-13 MED ORDER — LIDOCAINE HCL (CARDIAC) PF 100 MG/5ML IV SOSY
PREFILLED_SYRINGE | INTRAVENOUS | Status: DC | PRN
  Administered 2018-06-13: 80 mg via INTRAVENOUS

## 2018-06-13 MED ORDER — SODIUM CHLORIDE 0.9 % IV SOLN
INTRAVENOUS | Status: DC
  Administered 2018-06-13: 08:00:00 via INTRAVENOUS

## 2018-06-13 MED ORDER — PROPOFOL 10 MG/ML IV BOLUS
INTRAVENOUS | Status: DC | PRN
  Administered 2018-06-13: 120 mg via INTRAVENOUS

## 2018-06-13 MED ORDER — FENTANYL CITRATE (PF) 100 MCG/2ML IJ SOLN
INTRAMUSCULAR | Status: DC | PRN
  Administered 2018-06-13: 50 ug via INTRAVENOUS

## 2018-06-13 MED ORDER — FENTANYL CITRATE (PF) 100 MCG/2ML IJ SOLN
INTRAMUSCULAR | Status: AC
  Filled 2018-06-13: qty 2

## 2018-06-13 NOTE — Anesthesia Preprocedure Evaluation (Signed)
Anesthesia Evaluation  Patient identified by MRN, date of birth, ID band Patient awake    Reviewed: Allergy & Precautions, NPO status , Patient's Chart, lab work & pertinent test results, reviewed documented beta blocker date and time   Airway Mallampati: III  TM Distance: >3 FB     Dental  (+) Chipped   Pulmonary former smoker,           Cardiovascular hypertension, Pt. on medications      Neuro/Psych PSYCHIATRIC DISORDERS Anxiety Depression  Neuromuscular disease    GI/Hepatic GERD  ,  Endo/Other  diabetes, Type 2Morbid obesity  Renal/GU Renal disease     Musculoskeletal  (+) Arthritis ,   Abdominal   Peds  Hematology  (+) anemia ,   Anesthesia Other Findings   Reproductive/Obstetrics                             Anesthesia Physical Anesthesia Plan  ASA: III  Anesthesia Plan: General   Post-op Pain Management:    Induction: Intravenous  PONV Risk Score and Plan:   Airway Management Planned:   Additional Equipment:   Intra-op Plan:   Post-operative Plan:   Informed Consent: I have reviewed the patients History and Physical, chart, labs and discussed the procedure including the risks, benefits and alternatives for the proposed anesthesia with the patient or authorized representative who has indicated his/her understanding and acceptance.     Plan Discussed with: CRNA  Anesthesia Plan Comments:         Anesthesia Quick Evaluation

## 2018-06-13 NOTE — Anesthesia Postprocedure Evaluation (Signed)
Anesthesia Post Note  Patient: Eileen Ritter  Procedure(s) Performed: ESOPHAGOGASTRODUODENOSCOPY (EGD) WITH PROPOFOL (N/A )  Patient location during evaluation: Endoscopy Anesthesia Type: General Level of consciousness: awake and alert Pain management: pain level controlled Vital Signs Assessment: post-procedure vital signs reviewed and stable Respiratory status: spontaneous breathing, nonlabored ventilation, respiratory function stable and patient connected to nasal cannula oxygen Cardiovascular status: blood pressure returned to baseline and stable Postop Assessment: no apparent nausea or vomiting Anesthetic complications: no     Last Vitals:  Vitals:   06/13/18 0835 06/13/18 0845  BP: (!) 120/55 128/69  Pulse: 71 72  Resp: 15 16  Temp:    SpO2: 97% 97%    Last Pain:  Vitals:   06/13/18 0845  TempSrc:   PainSc: 0-No pain                 Missael Ferrari S

## 2018-06-13 NOTE — Anesthesia Post-op Follow-up Note (Signed)
Anesthesia QCDR form completed.        

## 2018-06-13 NOTE — Op Note (Signed)
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 Estimated blood loss: None. Procedure:            Pre-Anesthesia Assessment:                       - Prior to the procedure, a History and Physical was                        performed, and patient medications, allergies and                        sensitivities were reviewed. The patient's tolerance of                        previous anesthesia was reviewed.                       - The risks and benefits of the procedure and the                        sedation options and risks were discussed with the                        patient. All questions were answered and informed                        consent was obtained.                       - ASA Grade Assessment: II - A patient with mild                        systemic disease.                       After obtaining informed consent, the endoscope was                        passed under direct vision. Throughout the procedure,                        the patient's blood pressure, pulse, and oxygen                        saturations were monitored continuously. The Endoscope                        was introduced through the mouth, and advanced to the  third part of duodenum. The upper GI endoscopy was                        accomplished with ease. The patient tolerated the                        procedure well. Findings:      The stomach was normal.      The  esophagus was normal.      The cardia and gastric fundus were normal on retroflexion.      A single 10 mm sessile polyp with no bleeding was found in the duodenal       bulb. The p[olyp looked half the size [facility] No biopsies were taken since I wanted to avoid fibrosis and scar       tissue Impression:           - Normal stomach.                       - Normal esophagus.                       - A single duodenal polyp.                       - No specimens collected. Recommendation:       - Discharge patient to home (with escort).                       - Resume previous diet.                       - Continue present medications.                       - Refer back to [facility] for repeat attempt to resect the                        duodenal polyp. Procedure Code(s):    --- Professional ---                       [phone], Esophagogastroduodenoscopy, flexible, transoral;                        diagnostic, including collection of specimen(s) by                        brushing or washing, when performed (separate procedure) Diagnosis Code(s):    --- Professional ---                       K31.7, Polyp of stomach and duodenum                       D01.49, Carcinoma in situ of other parts of intestine CPT copyright 2017 American Medical Association. All rights reserved. The codes documented in this report are preliminary and upon coder review may  be revised to meet current compliance requirements. Jonathon Bellows, MD Jonathon Bellows MD, MD 06/13/2018 8:15:18 AM This report has been signed electronically. Number of Addenda: 0 Note Initiated On: 06/13/2018 8:04 AM      [place]  [facility]

## 2018-06-13 NOTE — Transfer of Care (Signed)
Immediate Anesthesia Transfer of Care Note  Patient: Eileen Ritter  Procedure(s) Performed: ESOPHAGOGASTRODUODENOSCOPY (EGD) WITH PROPOFOL (N/A )  Patient Location: PACU  Anesthesia Type:General  Level of Consciousness: awake, alert  and oriented  Airway & Oxygen Therapy: Patient Spontanous Breathing and Patient connected to nasal cannula oxygen  Post-op Assessment: Report given to RN and Post -op Vital signs reviewed and stable  Post vital signs: Reviewed and stable  Last Vitals:  Vitals Value Taken Time  BP    Temp    Pulse 77 06/13/2018  8:15 AM  Resp    SpO2 97 % 06/13/2018  8:15 AM  Vitals shown include unvalidated device data.  Last Pain:  Vitals:   06/13/18 0727  TempSrc: Tympanic  PainSc: 0-No pain         Complications: No apparent anesthesia complications

## 2018-06-13 NOTE — H&P (Signed)
Jonathon Bellows, MD [address], [place], [place], [place], [address] [address], [place], [place], [place], [address] Phone: [phone]  Fax: [phone]  Primary Care Physician:  Kathrine Haddock, NP   Pre-Procedure History & Physical: HPI:  Eileen Ritter is a 52 y.o. female is here for an endoscopy    Past Medical History:  Diagnosis Date  . Allergic rhinitis   . Anxiety   . Chronic kidney disease   . Depression   . Diabetes ([place])   . GERD (gastroesophageal reflux disease)   . HBP (high blood pressure)   . High cholesterol   . Iron deficiency anemia due to chronic blood loss 01/13/2018  . Obesity     Past Surgical History:  Procedure Laterality Date  . APPENDECTOMY    . COLONOSCOPY WITH PROPOFOL N/A 09/23/2017   Procedure: COLONOSCOPY WITH PROPOFOL;  Surgeon: Jonathon Bellows, MD;  Location: [facility] ENDOSCOPY;  Service: Gastroenterology;  Laterality: N/A;  . CYST EXCISION     back  . ESOPHAGOGASTRODUODENOSCOPY (EGD) WITH PROPOFOL N/A 09/23/2017   Procedure: ESOPHAGOGASTRODUODENOSCOPY (EGD) WITH PROPOFOL;  Surgeon: Jonathon Bellows, MD;  Location: [facility] ENDOSCOPY;  Service: Gastroenterology;  Laterality: N/A;  . TONSILLECTOMY    . TUBAL LIGATION      Prior to Admission medications   Medication Sig Start Date End Date Taking? Authorizing Provider  Ascorbic Acid (VITAMIN C) 100 MG tablet Take 100 mg by mouth daily.   Yes [provider]  atorvastatin (LIPITOR) 40 MG tablet TAKE 1 TABLET BY MOUTH EVERY DAY 01/28/18  Yes Kathrine Haddock, NP  buPROPion (WELLBUTRIN SR) 150 MG 12 hr tablet TAKE 1 TABLET (150 MG TOTAL) BY MOUTH DAILY. 06/17/17  Yes Kathrine Haddock, NP  Cholecalciferol (VITAMIN D) 2000 units tablet Take 2,000 Units by mouth daily.   Yes [provider]  diclofenac sodium (VOLTAREN) 1 % GEL Apply 4 g topically 4 (four) times daily. 05/03/18  Yes Kathrine Haddock, NP  Ferrous Sulfate Dried (SLOW RELEASE IRON) 45 MG TBCR Take 45 mg by mouth daily.    Yes [provider]  Fluoxetine HCl, PMDD, 20 MG TABS TAKE 1 TABLET BY MOUTH EVERYDAY AT BEDTIME 12/09/17  Yes [provider]  Fluoxetine HCl, PMDD, 20 MG TABS TAKE 1 TABLET BY MOUTH EVERYDAY AT BEDTIME 06/07/18  Yes Johnson, Megan P, DO  fluticasone (FLONASE) 50 MCG/ACT nasal spray Place 2 sprays into both nostrils daily.   Yes [provider]  INVOKANA 300 MG TABS tablet TAKE 1 TABLET (300 MG TOTAL) BY MOUTH DAILY BEFORE BREAKFAST. 08/12/17  Yes Johnson, Megan P, DO  losartan (COZAAR) 50 MG tablet TAKE 1 TABLET BY MOUTH EVERY DAY 06/07/18  Yes Johnson, Megan P, DO  metFORMIN (GLUCOPHAGE) 500 MG tablet TAKE 2 TABLETS BY MOUTH TWICE A DAY WITH A MEAL 03/25/18  Yes Crissman, Jeannette How, MD  omeprazole (PRILOSEC) 20 MG capsule TAKE 1 CAPSULE BY MOUTH EVERY DAY 01/04/18  Yes Kathrine Haddock, NP  Probiotic Product (PROBIOTIC PO) Take 2 tablets by mouth daily.   Yes [provider]  Semaglutide (OZEMPIC) 0.25 or 0.5 MG/DOSE SOPN Inject 0.5 mg into the skin once a week. 06/01/18  Yes Crissman, Jeannette How, MD  vitamin B-12 (CYANOCOBALAMIN) 100 MCG tablet Take 100 mcg by mouth daily.   Yes [provider]  cyclobenzaprine (FLEXERIL) 10 MG tablet Take 1 tablet (10 mg total) by mouth as needed for muscle spasms. Patient not taking: Reported on 06/13/2018 09/11/15   Julian Hy,  Malachy Mood, NP  fluconazole (DIFLUCAN) 150 MG tablet TAKE 1 TABLET BY MOUTH AS ONE DOSE 01/28/18   [provider]    Allergies as of 02/14/2018  . (No Known Allergies)    Family History  Problem Relation Age of Onset  . Diabetes Mother   . Heart disease Mother   . Hypertension Mother   . Stroke Mother   . Cancer Mother        LUNG  . Mental illness Mother   . Thyroid disease Mother   . Diabetes Father   . Heart disease Father   . Diabetes Sister   . Diabetes Brother   . Heart disease Brother   . Heart disease Maternal Grandmother   . Breast cancer Neg Hx     Social History    Socioeconomic History  . Marital status: Single    Spouse name: Not on file  . Number of children: Not on file  . Years of education: Not on file  . Highest education level: Not on file  Occupational History  . Not on file  Social Needs  . Financial resource strain: Not on file  . Food insecurity:    Worry: Not on file    Inability: Not on file  . Transportation needs:    Medical: Not on file    Non-medical: Not on file  Tobacco Use  . Smoking status: Former Smoker    Last attempt to quit: 11/01/2013    Years since quitting: 4.6  . Smokeless tobacco: Never Used  Substance and Sexual Activity  . Alcohol use: No  . Drug use: No  . Sexual activity: Yes  Lifestyle  . Physical activity:    Days per week: Not on file    Minutes per session: Not on file  . Stress: Not on file  Relationships  . Social connections:    Talks on phone: Not on file    Gets together: Not on file    Attends religious service: Not on file    Active member of club or organization: Not on file    Attends meetings of clubs or organizations: Not on file    Relationship status: Not on file  . Intimate partner violence:    Fear of current or ex partner: Not on file    Emotionally abused: Not on file    Physically abused: Not on file    Forced sexual activity: Not on file  Other Topics Concern  . Not on file  Social History Narrative  . Not on file    Review of Systems: See HPI, otherwise negative ROS  Physical Exam: BP (!) 143/71   Pulse 85   Temp (!) 97.2 F (36.2 C) (Tympanic)   Resp 18   Ht 5\' 2"  (1.575 m)   Wt 280 lb (127 kg)   BMI 51.21 kg/m  General:   Alert,  pleasant and cooperative in NAD Head:  Normocephalic and atraumatic. Neck:  Supple; no masses or thyromegaly. Lungs:  Clear throughout to auscultation, normal respiratory effort.    Heart:  +S1, +S2, Regular rate and rhythm, No edema. Abdomen:  Soft, nontender and nondistended. Normal bowel sounds, without guarding, and  without rebound.   Neurologic:  Alert and  oriented x4;  grossly normal neurologically.  Impression/Plan: Eileen Ritter is here for an endoscopy  to be performed for  evaluation of prior duodenal polyp that was excised    Risks, benefits, limitations, and alternatives regarding endoscopy have been reviewed  with the patient.  Questions have been answered.  All parties agreeable.   Jonathon Bellows, MD  06/13/2018, 8:02 AM

## 2018-06-14 ENCOUNTER — Encounter: Payer: Self-pay | Admitting: Gastroenterology

## 2018-06-17 ENCOUNTER — Encounter: Payer: Self-pay | Admitting: Unknown Physician Specialty

## 2018-06-17 ENCOUNTER — Other Ambulatory Visit: Payer: Self-pay | Admitting: Unknown Physician Specialty

## 2018-06-17 ENCOUNTER — Ambulatory Visit (INDEPENDENT_AMBULATORY_CARE_PROVIDER_SITE_OTHER): Payer: Commercial Managed Care - PPO | Admitting: Unknown Physician Specialty

## 2018-06-17 ENCOUNTER — Other Ambulatory Visit: Payer: Self-pay

## 2018-06-17 DIAGNOSIS — E1165 Type 2 diabetes mellitus with hyperglycemia: Secondary | ICD-10-CM

## 2018-06-17 MED ORDER — ONDANSETRON HCL 4 MG PO TABS: 4.0000 mg | ORAL_TABLET | Freq: Three times a day (TID) | ORAL | 0 refills | Status: DC | PRN

## 2018-06-17 NOTE — Assessment & Plan Note (Signed)
Pt doing well with Ozempic but nausea untenable.  Will decrease back to .25mg  and try to increase to .5mg  at a later date.  Will also rx Zofran for nausea prn.

## 2018-06-17 NOTE — Progress Notes (Signed)
   BP 131/73   Pulse 73   Temp (!) 97.5 F (36.4 C) (Tympanic)   Ht 5\' 2"  (1.575 m)   Wt 282 lb 3.2 oz (128 kg)   SpO2 97%   BMI 51.62 kg/m    Subjective:    Patient ID: Eileen Ritter, female    DOB: May 05, 1966, 52 y.o.   MRN: [id number]  HPI: Eileen Ritter is a 52 y.o. female  Chief Complaint  Patient presents with  . Diabetes    f/u   Diabetes: Pt states the Ozempic is working for her numbers but the nausea is too extreme and wants to know how to get past it.  Eating small amounts.  In some ways she feels better.  Has lost weight.  (9 lbs)    Relevant past medical, surgical, family and social history reviewed and updated as indicated. Interim medical history since our last visit reviewed. Allergies and medications reviewed and updated.  Review of Systems  Per HPI unless specifically indicated above     Objective:    BP 131/73   Pulse 73   Temp (!) 97.5 F (36.4 C) (Tympanic)   Ht 5\' 2"  (1.575 m)   Wt 282 lb 3.2 oz (128 kg)   SpO2 97%   BMI 51.62 kg/m   Wt Readings from Last 3 Encounters:  06/17/18 282 lb 3.2 oz (128 kg)  06/13/18 280 lb (127 kg)  05/03/18 291 lb 6.4 oz (132.2 kg)    Physical Exam  Constitutional: She is oriented to person, place, and time. She appears well-developed and well-nourished. No distress.  HENT:  Head: Normocephalic and atraumatic.  Eyes: Conjunctivae and lids are normal. Right eye exhibits no discharge. Left eye exhibits no discharge. No scleral icterus.  Cardiovascular: Normal rate.  Pulmonary/Chest: Effort normal.  Abdominal: Normal appearance. There is no splenomegaly or hepatomegaly.  Musculoskeletal: Normal range of motion.  Neurological: She is alert and oriented to person, place, and time.  Skin: Skin is intact. No rash noted. No pallor.  Psychiatric: She has a normal mood and affect. Her behavior is normal. Judgment and thought content normal.    Results for orders placed or performed during the  hospital encounter of 06/13/18  Glucose, capillary  Result Value Ref Range   Glucose-Capillary 148 (H) 70 - 99 mg/dL  Pregnancy, urine POC  Result Value Ref Range   Preg Test, Ur NEGATIVE NEGATIVE      Assessment & Plan:   Problem List Items Addressed This Visit      Unprioritized   Uncontrolled type 2 diabetes mellitus ([place])    Pt doing well with Ozempic but nausea untenable.  Will decrease back to .25mg  and try to increase to .5mg  at a later date.  Will also rx Zofran for nausea prn.            Follow up plan: Return in about 6 weeks (around 07/29/2018) for for A1C.

## 2018-06-22 ENCOUNTER — Inpatient Hospital Stay: Payer: Commercial Managed Care - PPO | Attending: Oncology | Admitting: Oncology

## 2018-06-22 ENCOUNTER — Other Ambulatory Visit: Payer: Self-pay

## 2018-06-22 ENCOUNTER — Other Ambulatory Visit: Payer: Self-pay | Admitting: Unknown Physician Specialty

## 2018-06-22 ENCOUNTER — Inpatient Hospital Stay: Payer: Commercial Managed Care - PPO

## 2018-06-22 ENCOUNTER — Encounter: Payer: Self-pay | Admitting: Oncology

## 2018-06-22 VITALS — BP 122/75 | HR 76 | Temp 96.6°F | Resp 18 | Wt 283.8 lb

## 2018-06-22 DIAGNOSIS — C7A010 Malignant carcinoid tumor of the duodenum: Secondary | ICD-10-CM

## 2018-06-22 DIAGNOSIS — D3A8 Other benign neuroendocrine tumors: Secondary | ICD-10-CM

## 2018-06-22 DIAGNOSIS — D509 Iron deficiency anemia, unspecified: Secondary | ICD-10-CM | POA: Insufficient documentation

## 2018-06-22 DIAGNOSIS — D3501 Benign neoplasm of right adrenal gland: Secondary | ICD-10-CM

## 2018-06-22 DIAGNOSIS — C7A8 Other malignant neuroendocrine tumors: Secondary | ICD-10-CM

## 2018-06-22 LAB — CBC WITH DIFFERENTIAL/PLATELET
BASOS PCT: 1 %
Basophils Absolute: 0.1 10*3/uL (ref 0–0.1)
EOS ABS: 0.3 10*3/uL (ref 0–0.7)
Eosinophils Relative: 2 %
HEMATOCRIT: 44.3 % (ref 35.0–47.0)
HEMOGLOBIN: 14.7 g/dL (ref 12.0–16.0)
Lymphocytes Relative: 34 %
Lymphs Abs: 4.4 10*3/uL — ABNORMAL HIGH (ref 1.0–3.6)
MCH: 26.1 pg (ref 26.0–34.0)
MCHC: 33.1 g/dL (ref 32.0–36.0)
MCV: 78.9 fL — ABNORMAL LOW (ref 80.0–100.0)
Monocytes Absolute: 1.3 10*3/uL — ABNORMAL HIGH (ref 0.2–0.9)
Monocytes Relative: 10 %
NEUTROS ABS: 6.9 10*3/uL — AB (ref 1.4–6.5)
NEUTROS PCT: 53 %
Platelets: 365 10*3/uL (ref 150–440)
RBC: 5.62 MIL/uL — ABNORMAL HIGH (ref 3.80–5.20)
RDW: 15.8 % — AB (ref 11.5–14.5)
WBC: 13 10*3/uL — ABNORMAL HIGH (ref 3.6–11.0)

## 2018-06-22 LAB — IRON AND TIBC
IRON: 44 ug/dL (ref 28–170)
Saturation Ratios: 12 % (ref 10.4–31.8)
TIBC: 373 ug/dL (ref 250–450)
UIBC: 329 ug/dL

## 2018-06-22 LAB — FERRITIN: FERRITIN: 16 ng/mL (ref 11–307)

## 2018-06-22 NOTE — Progress Notes (Signed)
Patient here for follow up. Pt states ozempic is causing "a lot of stomach discomfort."

## 2018-06-22 NOTE — Telephone Encounter (Signed)
Rx refill request: Bupropion 150 mg         Ordered: 06/17/17  Last filled: 03/25/18  LOV: 06/17/18  PCP: [place]: verified

## 2018-06-23 LAB — CHROMOGRANIN A: CHROMOGRANIN A: 12 nmol/L — AB (ref 0–5)

## 2018-06-23 NOTE — Progress Notes (Signed)
Hematology/Oncology  Follow up note [facility] [phone] Fax:([phone]   Patient Care Team: Kathrine Haddock, NP as PCP - General (Nurse Practitioner)  REFERRING PROVIDER: Nilsa Nutting REASON FOR VISIT Follow up for management of  neuroendocrine carcinoma  HISTORY OF PRESENTING ILLNESS:  Eileen Ritter is a  52 y.o.  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 Patient was referred to see gastroenterologist Dr. Vicente Males for evaluation of iron deficiency anemia.  Patient underwent EGD and a colonoscopy on September 23, 2017.  Colonoscopy revealed tubular adenoma x1 status post resection.  EGD showed polypoid mass seen in the duodenum.  No biopsy was taken patient was referred to [facility] for EMR versus ESD. Patient underwent EUS at [facility] on December 01, 2017.  The duodenum polyp/mass was submucosal on EUS, about 1 cm.  A band was placed at the base of the polyp, biopsy was taken from the polyp which demonstrated well-differentiated grade 1 neuroendocrine carcinoma.  Patient was told that the mass is expected to drop off and she has planned for repeat EGD at [facility] in about 6 months. Patient takes ferrous sulfate slow release 45 mg daily.  She also takes B12 100 mcg by mouth daily. Today she feels that fatigue has getting better after starting iron supplementation. She has intermittent hot flushes which she attributes to going through menopausal.  She denies any diarrhea or abdominal pain..   Patient is perimenopausal with heavy menstrual bleeding history, and endograft regular cycles lately.  Last period was during last December.  INTERVAL HISTORY Eileen Ritter is a 52 y.o. female who has above history reviewed by me today presents for follow up visit for management of neuroendocrine carcinoma. Problems and complaints are listed below: #Stomach discomfort,  recently seen by Dr. Vicente Males  and repeated EGD on 06/13/2018 which showed a single 10 mm sessile polyp with no bleeding, found in the duodenal bulb.  No biopsy was taken patient was referred back to [facility]  For repeat attempt of resecting duodenal polyp.  Otherwise doing well.   Review of Systems  Constitutional: Positive for malaise/fatigue. Negative for chills, fever and weight loss.  HENT: Negative for congestion, ear discharge, ear pain, nosebleeds, sinus pain and sore throat.   Eyes: Negative for double vision, photophobia, pain, discharge and redness.  Respiratory: Negative for cough, hemoptysis, sputum production, shortness of breath and wheezing.   Cardiovascular: Negative for chest pain, palpitations, orthopnea, claudication and leg swelling.  Gastrointestinal: Negative for abdominal pain, blood in stool, constipation, diarrhea, heartburn, melena, nausea and vomiting.  Genitourinary: Negative for dysuria, flank pain, frequency and hematuria.  Musculoskeletal: Negative for back pain, myalgias and neck pain.  Skin: Negative for itching and rash.  Neurological: Negative for dizziness, tingling, tremors, focal weakness, weakness and headaches.  Endo/Heme/Allergies: Negative for environmental allergies. Does not bruise/bleed easily.  Psychiatric/Behavioral: Negative for depression and hallucinations. The patient is not nervous/anxious.     MEDICAL HISTORY:  Past Medical History:  Diagnosis Date  . Allergic rhinitis   . Anxiety   . Chronic kidney disease   . Depression   . Diabetes ([place])   . GERD (gastroesophageal reflux disease)   . HBP (high blood pressure)   . High cholesterol   . Iron deficiency anemia due to chronic blood loss 01/13/2018  . Obesity     SURGICAL HISTORY: Past Surgical History:  Procedure Laterality Date  . APPENDECTOMY    .  COLONOSCOPY WITH PROPOFOL N/A 09/23/2017   Procedure: COLONOSCOPY WITH PROPOFOL;  Surgeon: Jonathon Bellows, MD;  Location: [facility] ENDOSCOPY;  Service: Gastroenterology;   Laterality: N/A;  . CYST EXCISION     back  . ESOPHAGOGASTRODUODENOSCOPY (EGD) WITH PROPOFOL N/A 09/23/2017   Procedure: ESOPHAGOGASTRODUODENOSCOPY (EGD) WITH PROPOFOL;  Surgeon: Jonathon Bellows, MD;  Location: [facility] ENDOSCOPY;  Service: Gastroenterology;  Laterality: N/A;  . ESOPHAGOGASTRODUODENOSCOPY (EGD) WITH PROPOFOL N/A 06/13/2018   Procedure: ESOPHAGOGASTRODUODENOSCOPY (EGD) WITH PROPOFOL;  Surgeon: Jonathon Bellows, MD;  Location: [facility] ENDOSCOPY;  Service: Gastroenterology;  Laterality: N/A;  . TONSILLECTOMY    . TUBAL LIGATION      SOCIAL HISTORY: Social History   Socioeconomic History  . Marital status: Single    Spouse name: Not on file  . Number of children: Not on file  . Years of education: Not on file  . Highest education level: Not on file  Occupational History  . Not on file  Social Needs  . Financial resource strain: Not on file  . Food insecurity:    Worry: Not on file    Inability: Not on file  . Transportation needs:    Medical: Not on file    Non-medical: Not on file  Tobacco Use  . Smoking status: Former Smoker    Last attempt to quit: 11/01/2013    Years since quitting: 4.6  . Smokeless tobacco: Never Used  Substance and Sexual Activity  . Alcohol use: No  . Drug use: No  . Sexual activity: Yes  Lifestyle  . Physical activity:    Days per week: Not on file    Minutes per session: Not on file  . Stress: Not on file  Relationships  . Social connections:    Talks on phone: Not on file    Gets together: Not on file    Attends religious service: Not on file    Active member of club or organization: Not on file    Attends meetings of clubs or organizations: Not on file    Relationship status: Not on file  . Intimate partner violence:    Fear of current or ex partner: Not on file    Emotionally abused: Not on file    Physically abused: Not on file    Forced sexual activity: Not on file  Other Topics Concern  . Not on file  Social History Narrative  .  Not on file    FAMILY HISTORY: Family History  Problem Relation Age of Onset  . Diabetes Mother   . Heart disease Mother   . Hypertension Mother   . Stroke Mother   . Cancer Mother        LUNG  . Mental illness Mother   . Thyroid disease Mother   . Diabetes Father   . Heart disease Father   . Diabetes Sister   . Diabetes Brother   . Heart disease Brother   . Heart disease Maternal Grandmother   . Breast cancer Neg Hx     ALLERGIES:  has No Known Allergies.  MEDICATIONS:  Current Outpatient Medications  Medication Sig Dispense Refill  . Ascorbic Acid (VITAMIN C) 100 MG tablet Take 100 mg by mouth daily.    Marland Kitchen atorvastatin (LIPITOR) 40 MG tablet TAKE 1 TABLET BY MOUTH EVERY DAY 90 tablet 1  . Cholecalciferol (VITAMIN D) 2000 units tablet Take 2,000 Units by mouth daily.    . cyclobenzaprine (FLEXERIL) 10 MG tablet Take 1 tablet (10 mg total) by mouth as  needed for muscle spasms. 90 tablet 1  . diclofenac sodium (VOLTAREN) 1 % GEL Apply 4 g topically 4 (four) times daily. 100 g 12  . Ferrous Sulfate Dried (SLOW RELEASE IRON) 45 MG TBCR Take 45 mg by mouth daily.    . Fluoxetine HCl, PMDD, 20 MG TABS TAKE 1 TABLET BY MOUTH EVERYDAY AT BEDTIME 90 tablet 1  . INVOKANA 300 MG TABS tablet TAKE 1 TABLET (300 MG TOTAL) BY MOUTH DAILY BEFORE BREAKFAST. 90 tablet 3  . losartan (COZAAR) 50 MG tablet TAKE 1 TABLET BY MOUTH EVERY DAY 90 tablet 0  . metFORMIN (GLUCOPHAGE) 500 MG tablet TAKE 2 TABLETS BY MOUTH TWICE A DAY WITH A MEAL 240 tablet 1  . omeprazole (PRILOSEC) 20 MG capsule TAKE 1 CAPSULE BY MOUTH EVERY DAY 90 capsule 1  . ondansetron (ZOFRAN) 4 MG tablet Take 1 tablet (4 mg total) by mouth every 8 (eight) hours as needed for nausea or vomiting. 20 tablet 0  . Semaglutide (OZEMPIC) 0.25 or 0.5 MG/DOSE SOPN Inject 0.5 mg into the skin once a week. 4 pen 5  . vitamin B-12 (CYANOCOBALAMIN) 100 MCG tablet Take 100 mcg by mouth daily.    Marland Kitchen buPROPion (WELLBUTRIN SR) 150 MG 12 hr tablet  TAKE 1 TABLET BY MOUTH EVERY DAY 90 tablet 3  . fluconazole (DIFLUCAN) 150 MG tablet TAKE 1 TABLET BY MOUTH AS ONE DOSE  0  . fluticasone (FLONASE) 50 MCG/ACT nasal spray Place 2 sprays into both nostrils daily.    . Probiotic Product (PROBIOTIC PO) Take 2 tablets by mouth daily.     No current facility-administered medications for this visit.      PHYSICAL EXAMINATION: ECOG PERFORMANCE STATUS: 0 - Asymptomatic Vitals:   06/22/18 1447  BP: 122/75  Pulse: 76  Resp: 18  Temp: (!) 96.6 F (35.9 C)   Filed Weights   06/22/18 1447  Weight: 283 lb 12.8 oz (128.7 kg)    Physical Exam  Constitutional: She is oriented to person, place, and time and well-developed, well-nourished, and in no distress. No distress.  Morbidly obese  HENT:  Head: Normocephalic and atraumatic.  Nose: Nose normal.  Mouth/Throat: Oropharynx is clear and moist. No oropharyngeal exudate.  Eyes: Pupils are equal, round, and reactive to light. EOM are normal. Left eye exhibits no discharge. No scleral icterus.  Neck: Normal range of motion. Neck supple. No JVD present.  Cardiovascular: Normal rate, regular rhythm and normal heart sounds.  No murmur heard. Pulmonary/Chest: Effort normal and breath sounds normal. No respiratory distress. She has no wheezes. She has no rales. She exhibits no tenderness.  Abdominal: Soft. Bowel sounds are normal. She exhibits no distension and no mass. There is no tenderness. There is no rebound.  Musculoskeletal: Normal range of motion. She exhibits no edema or tenderness.  Lymphadenopathy:    She has no cervical adenopathy.  Neurological: She is alert and oriented to person, place, and time. No cranial nerve deficit. She exhibits normal muscle tone. Coordination normal.  Skin: Skin is warm and dry. No rash noted. She is not diaphoretic. No erythema.  Psychiatric: Affect and judgment normal.     LABORATORY DATA:  I have reviewed the data as listed Lab Results  Component  Value Date   WBC 13.0 (H) 06/22/2018   HGB 14.7 06/22/2018   HCT 44.3 06/22/2018   MCV 78.9 (L) 06/22/2018   PLT 365 06/22/2018   Recent Labs    01/13/18 1425 01/28/18 0916 05/03/18 1610  NA 137 139 140  K 4.1 4.7 4.7  CL 97* 97 102  CO2 27 21 20   GLUCOSE 136* 165* 168*  BUN 15 15 16   CREATININE 0.77 0.69 0.57  CALCIUM 9.4 9.7 9.4  GFRNONAA >60 103 108  GFRAA >60 118 125  PROT 7.3 6.9 6.2  ALBUMIN 4.1 4.3 4.3  AST 24 14 21   ALT 28 30 33*  ALKPHOS 77 94 88  BILITOT 0.4 <0.2 0.3    Pathology A: Small bowel, duodenum, biopsy - Neuroendocrine tumor grade 1 (G1; well differentiated neuroendocrine tumor, carcinoid)  - Tumor size at least 2.5 mm in greatest dimension - Neuroendocrine tumor extends to edges of specimen   ASSESSMENT & PLAN:  1. Iron deficiency anemia, unspecified iron deficiency anemia type   2. Neuroendocrine tumor   3. Adrenal adenoma, right   4. Neuroendocrine carcinoma ([place])    #Duodenum neuroendocrine carcinoma,# Iron deficiency anemia, iron level continue to improve.  Continue oral iron supplementation.  likely from menorrhagia +/- GI blood loss.   #Low-grade well-differentiated neuroendocrine carcinoma of the duodenum,She is s/p  band placed and expect the mass to fall off.  However repeat EGD recently showed persistence of neuroendocrine carcinoma.  continue follow-up with [facility] gastroenterology for reattempts resection Previous DOTADATE scan negative for metastatic disease. Baseline 5-HIAA level normal, mild elevated chromogranin A.  Repeat chromogranin A today and the level is pending. All questions were answered. The patient knows to call the clinic with any problems questions or concerns.  Return of visit: 2 months Total face to face encounter time for this patient visit was 74min. >50% of the time was  spent in counseling and coordination of care.   Earlie Server, MD, PhD Hematology Oncology [facility] at [facility] Pager-  [phone] 06/23/2018

## 2018-06-28 ENCOUNTER — Telehealth: Payer: Self-pay

## 2018-06-28 NOTE — Telephone Encounter (Signed)
-----   Message from Jonathon Bellows, MD sent at 06/13/2018  8:23 AM EDT ----- Regarding: [facility] referral  She has a polyp taken out from the duodenum at [facility].  I did the EGD today and there is still a good size remaining piece left behind which needs to be taken out. Will need repeat referral to take out the polyps   I should see her after that in the office   Kiran

## 2018-06-28 NOTE — Telephone Encounter (Signed)
New procedure referral faxed to [facility]. Pt will be contacted by scheduling to set up procedure.

## 2018-07-12 ENCOUNTER — Encounter: Payer: Self-pay | Admitting: Oncology

## 2018-07-13 ENCOUNTER — Encounter: Payer: Self-pay | Admitting: Gastroenterology

## 2018-07-13 ENCOUNTER — Ambulatory Visit (INDEPENDENT_AMBULATORY_CARE_PROVIDER_SITE_OTHER): Payer: Commercial Managed Care - PPO | Admitting: Gastroenterology

## 2018-07-13 VITALS — BP 118/76 | HR 87 | Ht 62.0 in | Wt 282.2 lb

## 2018-07-13 DIAGNOSIS — D509 Iron deficiency anemia, unspecified: Secondary | ICD-10-CM | POA: Diagnosis not present

## 2018-07-13 DIAGNOSIS — D3A019 Benign carcinoid tumor of the small intestine, unspecified portion: Secondary | ICD-10-CM | POA: Diagnosis not present

## 2018-07-13 NOTE — Addendum Note (Signed)
Addended by: Earl Lagos on: 07/13/2018 08:57 AM   Modules accepted: Orders, SmartSet

## 2018-07-13 NOTE — Progress Notes (Signed)
Eileen Bellows MD, MRCP(U.K) [address]  [place]  [place], [place] [address]  Main: [phone]  Fax: [phone]   Primary Care Physician: Kathrine Haddock, NP  Primary Gastroenterologist:  Dr. Jonathon Ritter   Chief Complaint  Patient presents with  . Follow-up    HPI: Eileen Ritter is a 52 y.o. female   Summary of history :  She was seen initially in 09/2017 when referred foriron deficiency anemia and also found to have low B12. Also being followed for a duodenal mass, Labs 07/2017 Hb 11 with MCV 66liver function tests's were normal. . She used to " a lot of ibuprofen" every day for months, .She used to take it for her knee.  Labs 09/2017 showed low iron , b12, urine shows no blood. Celiac serology negative. 09/23/17: Colonoscopy tubular adenoma x1 resected.  09/23/17: EGD polypoid mass seen in the duodenum :no biopsies taken: referred to [facility] for EMR vs ESD EUS at [facility] 12/01/17: the duodenal polyp was submucosal on EUS 1 cm , a band was placed at the base of the polyp, biopsies were taken from the polyp which demonstrated NET, grade 1 well deffrientiated. Unclear if the polyp was resected after the band was placed or expected to drop off . Plan from [facility] is for repeat EGD in 6 months.   On iron replacement therapy and b12 therapy  Interval history3/4/19 -07/13/18    I performed an EGD on 06/13/18 to check for any residual polyp of the duodenum. Noted persistence of residual polyp in the duodenal bulb. I did not take any biopsies to avoid any scar tissue.   Iron deficiency anemia has normalized on labs checked 06/23/18. Elevated Chromogranin level   Due to see [facility] on October 28th for resection of residual polyp of the duodenum. She is feeling ok  .    She is willing to get her small bowel evaluated with a capsule study . Doing well. Occasional nausea.      Current Outpatient Medications  Medication Sig Dispense Refill  . Ascorbic Acid (VITAMIN C) 100  MG tablet Take 100 mg by mouth daily.    Marland Kitchen atorvastatin (LIPITOR) 40 MG tablet TAKE 1 TABLET BY MOUTH EVERY DAY 90 tablet 1  . buPROPion (WELLBUTRIN SR) 150 MG 12 hr tablet TAKE 1 TABLET BY MOUTH EVERY DAY 90 tablet 3  . Cholecalciferol (VITAMIN D) 2000 units tablet Take 2,000 Units by mouth daily.    . cyclobenzaprine (FLEXERIL) 10 MG tablet Take 1 tablet (10 mg total) by mouth as needed for muscle spasms. 90 tablet 1  . diclofenac sodium (VOLTAREN) 1 % GEL Apply 4 g topically 4 (four) times daily. 100 g 12  . Ferrous Sulfate Dried (SLOW RELEASE IRON) 45 MG TBCR Take 45 mg by mouth daily.    . fluconazole (DIFLUCAN) 150 MG tablet TAKE 1 TABLET BY MOUTH AS ONE DOSE  0  . Fluoxetine HCl, PMDD, 20 MG TABS TAKE 1 TABLET BY MOUTH EVERYDAY AT BEDTIME 90 tablet 1  . fluticasone (FLONASE) 50 MCG/ACT nasal spray Place 2 sprays into both nostrils daily.    . INVOKANA 300 MG TABS tablet TAKE 1 TABLET (300 MG TOTAL) BY MOUTH DAILY BEFORE BREAKFAST. 90 tablet 3  . losartan (COZAAR) 50 MG tablet TAKE 1 TABLET BY MOUTH EVERY DAY 90 tablet 0  . metFORMIN (GLUCOPHAGE) 500 MG tablet TAKE 2 TABLETS BY MOUTH TWICE A DAY WITH A MEAL 240 tablet 1  . omeprazole (PRILOSEC) 20 MG  capsule TAKE 1 CAPSULE BY MOUTH EVERY DAY 90 capsule 1  . ondansetron (ZOFRAN) 4 MG tablet Take 1 tablet (4 mg total) by mouth every 8 (eight) hours as needed for nausea or vomiting. 20 tablet 0  . Probiotic Product (PROBIOTIC PO) Take 2 tablets by mouth daily.    . Semaglutide (OZEMPIC) 0.25 or 0.5 MG/DOSE SOPN Inject 0.5 mg into the skin once a week. 4 pen 5  . vitamin B-12 (CYANOCOBALAMIN) 100 MCG tablet Take 100 mcg by mouth daily.     No current facility-administered medications for this visit.     Allergies as of 07/13/2018  . (No Known Allergies)    ROS:  General: Negative for anorexia, weight loss, fever, chills, fatigue, weakness. ENT: Negative for hoarseness, difficulty swallowing , nasal congestion. CV: Negative for chest  pain, angina, palpitations, dyspnea on exertion, peripheral edema.  Respiratory: Negative for dyspnea at rest, dyspnea on exertion, cough, sputum, wheezing.  GI: See history of present illness. GU:  Negative for dysuria, hematuria, urinary incontinence, urinary frequency, nocturnal urination.  Endo: Negative for unusual weight change.    Physical Examination:   There were no vitals taken for this visit.  General: Well-nourished, well-developed in no acute distress.  Eyes: No icterus. Conjunctivae pink. Mouth: Oropharyngeal mucosa moist and pink , no lesions erythema or exudate. Lungs: Clear to auscultation bilaterally. Non-labored. Heart: Regular rate and rhythm, no murmurs rubs or gallops.  Abdomen: Bowel sounds are normal, nontender, nondistended, no hepatosplenomegaly or masses, no abdominal bruits or hernia , no rebound or guarding.   Extremities: No lower extremity edema. No clubbing or deformities. Neuro: Alert and oriented x 3.  Grossly intact. Skin: Warm and dry, no jaundice.   Psych: Alert and cooperative, normal mood and affect.   Imaging Studies: No results found.  Assessment and Plan:   Eileen Ritter is a 52 y.o. y/o female here to follow up for iron deficiency anemia. EGD+colonoscopy revealed no source for the anemia but during EGD noted a duodenal polyp which was referred to [facility] for excision .They performed an EUSSample bx was taken and a ligation band placed at the base- expect the polyp to drop off.Pathology showsduodenal grade 1 carcinoid-.On IV iron and B 12 shots.Likely anemia due to long term NSAID use . Repeat EGD in 05/2018 shows residual tumor and referred back to [facility] for resection. Nauses could be from diabetic gastroparesis .   Plan  1. She would like to proceed with small bowel capsule study to evaluate for iron deficiency as well as rule any small bowel luminal carcinoid  2. 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 Repeat EGD in  01/2019 to check resection site for residual polyp  4. Low residue and low fat diet, tighter glycemic control for gastroparesis which I suspect    Risks, benefits, alternatives of Givens capsule discussed with patient to include but not limited to the rare risk of Given's capsule becoming lodged in the GI tract requiring surgical removal.  The patient agrees with this plan & consent will be obtained.    Dr Eileen Bellows  MD,MRCP [facility]) Follow up in 6 months

## 2018-07-20 ENCOUNTER — Encounter: Payer: Self-pay | Admitting: Gastroenterology

## 2018-07-20 ENCOUNTER — Ambulatory Visit
Admission: RE | Admit: 2018-07-20 | Discharge: 2018-07-20 | Disposition: A | Discharge status: Home / Self Care | Payer: Commercial Managed Care - PPO | Source: Ambulatory Visit | Attending: Gastroenterology | Admitting: Gastroenterology

## 2018-07-20 ENCOUNTER — Encounter
Admission: RE | Disposition: A | Discharge status: Home / Self Care | Payer: Self-pay | Source: Ambulatory Visit | Attending: Gastroenterology

## 2018-07-20 DIAGNOSIS — D509 Iron deficiency anemia, unspecified: Secondary | ICD-10-CM | POA: Diagnosis present

## 2018-07-20 HISTORY — PX: GIVENS CAPSULE STUDY: SHX5432

## 2018-07-20 SURGERY — IMAGING PROCEDURE, GI TRACT, INTRALUMINAL, VIA CAPSULE

## 2018-07-28 ENCOUNTER — Other Ambulatory Visit: Payer: Self-pay | Admitting: Family Medicine

## 2018-07-28 ENCOUNTER — Encounter: Payer: Self-pay | Admitting: Gastroenterology

## 2018-07-28 NOTE — Telephone Encounter (Signed)
Metformin refill Last Refill:03/25/18 # 240 tab 1 RF Last OV: 05/03/18 PCP: Carles Collet PA Pharmacy:[facility] [id number]Hgb A1C: 8.5 05/03/18

## 2018-08-01 ENCOUNTER — Ambulatory Visit (INDEPENDENT_AMBULATORY_CARE_PROVIDER_SITE_OTHER): Payer: Commercial Managed Care - PPO | Admitting: Family Medicine

## 2018-08-01 ENCOUNTER — Other Ambulatory Visit: Payer: Self-pay

## 2018-08-01 ENCOUNTER — Encounter: Payer: Self-pay | Admitting: Family Medicine

## 2018-08-01 VITALS — BP 123/76 | HR 72 | Temp 98.3°F | Ht 62.0 in | Wt 278.0 lb

## 2018-08-01 DIAGNOSIS — I10 Essential (primary) hypertension: Secondary | ICD-10-CM

## 2018-08-01 DIAGNOSIS — E782 Mixed hyperlipidemia: Secondary | ICD-10-CM | POA: Diagnosis not present

## 2018-08-01 DIAGNOSIS — F3341 Major depressive disorder, recurrent, in partial remission: Secondary | ICD-10-CM

## 2018-08-01 DIAGNOSIS — Z6841 Body Mass Index (BMI) 40.0 and over, adult: Secondary | ICD-10-CM

## 2018-08-01 DIAGNOSIS — E1165 Type 2 diabetes mellitus with hyperglycemia: Secondary | ICD-10-CM | POA: Diagnosis not present

## 2018-08-01 LAB — BAYER DCA HB A1C WAIVED: HB A1C (BAYER DCA - WAIVED): 7.1 % — ABNORMAL HIGH (ref <7.0)

## 2018-08-01 LAB — LIPID PANEL PICCOLO, WAIVED
Chol/HDL Ratio Piccolo,Waive: 3.4 mg/dL
Cholesterol Piccolo, Waived: 150 mg/dL (ref <200)
HDL Chol Piccolo, Waived: 44 mg/dL — ABNORMAL LOW (ref >59)
LDL Chol Calc Piccolo Waived: 84 mg/dL (ref <100)
Triglycerides Piccolo,Waived: 111 mg/dL (ref <150)
VLDL Chol Calc Piccolo,Waive: 22 mg/dL (ref <30)

## 2018-08-01 NOTE — Progress Notes (Signed)
BP 123/76   Pulse 72   Temp 98.3 F (36.8 C) (Oral)   Ht 5\' 2"  (1.575 m)   Wt 278 lb (126.1 kg)   SpO2 97%   BMI 50.85 kg/m    Subjective:    Patient ID: Eileen Ritter, female    DOB: 03-Apr-1966, 52 y.o.   MRN: [id number]  HPI: Eileen Ritter is a 52 y.o. female  Chief Complaint  Patient presents with  . Diabetes    A1c check up   Here today for chronic illness management.   Tolerating the ozempic much better, back up to the 0.5 dose. Having nausea only about once a week. Sxs much less now that she's eating less. Not having to take the zofran anymore. Has lost about 15 lb since starting the medication a couple of months ago. Does not check home blood sugars. No hypoglycemic events.   BPs WNL when checked outside of clinic. Compliant with medication, no reported side effects. Tolerating her lipitor well. No myalgias, claudication, CP, SOB.   Moods under good control with wellbutrin and prozac. Denies SI/HI.   Depression screen [facility] 2/9 08/01/2018 05/03/2018 01/28/2018  Decreased Interest 0 0 0  Down, Depressed, Hopeless 1 0 0  PHQ - 2 Score 1 0 0  Altered sleeping 0 0 0  Tired, decreased energy 0 0 0  Change in appetite 0 1 2  Feeling bad or failure about yourself  0 0 0  Trouble concentrating 0 0 1  Moving slowly or fidgety/restless 0 0 0  Suicidal thoughts 0 0 0  PHQ-9 Score 1 1 3    Past Medical History:  Diagnosis Date  . Allergic rhinitis   . Anxiety   . Chronic kidney disease   . Depression   . Diabetes ([place])   . GERD (gastroesophageal reflux disease)   . HBP (high blood pressure)   . High cholesterol   . Iron deficiency anemia due to chronic blood loss 01/13/2018  . Obesity    Social History   Socioeconomic History  . Marital status: Single    Spouse name: Not on file  . Number of children: Not on file  . Years of education: Not on file  . Highest education level: Not on file  Occupational History  . Not on file  Social Needs  .  Financial resource strain: Not on file  . Food insecurity:    Worry: Not on file    Inability: Not on file  . Transportation needs:    Medical: Not on file    Non-medical: Not on file  Tobacco Use  . Smoking status: Former Smoker    Last attempt to quit: 11/01/2013    Years since quitting: 4.7  . Smokeless tobacco: Never Used  Substance and Sexual Activity  . Alcohol use: No  . Drug use: No  . Sexual activity: Yes  Lifestyle  . Physical activity:    Days per week: Not on file    Minutes per session: Not on file  . Stress: Not on file  Relationships  . Social connections:    Talks on phone: Not on file    Gets together: Not on file    Attends religious service: Not on file    Active member of club or organization: Not on file    Attends meetings of clubs or organizations: Not on file    Relationship status: Not on file  . Intimate partner violence:    Fear of current  or ex partner: Not on file    Emotionally abused: Not on file    Physically abused: Not on file    Forced sexual activity: Not on file  Other Topics Concern  . Not on file  Social History Narrative  . Not on file   Relevant past medical, surgical, family and social history reviewed and updated as indicated. Interim medical history since our last visit reviewed. Allergies and medications reviewed and updated.  Review of Systems  Per HPI unless specifically indicated above     Objective:    BP 123/76   Pulse 72   Temp 98.3 F (36.8 C) (Oral)   Ht 5\' 2"  (1.575 m)   Wt 278 lb (126.1 kg)   SpO2 97%   BMI 50.85 kg/m   Wt Readings from Last 3 Encounters:  08/01/18 278 lb (126.1 kg)  07/13/18 282 lb 3.2 oz (128 kg)  06/22/18 283 lb 12.8 oz (128.7 kg)    Physical Exam  Constitutional: She is oriented to person, place, and time. She appears well-developed and well-nourished. No distress.  HENT:  Head: Atraumatic.  Eyes: Pupils are equal, round, and reactive to light. Conjunctivae are normal. No  scleral icterus.  Neck: Normal range of motion. Neck supple.  Cardiovascular: Normal rate, regular rhythm, normal heart sounds and intact distal pulses.  Pulmonary/Chest: Effort normal and breath sounds normal. No respiratory distress.  Musculoskeletal: Normal range of motion. She exhibits no edema or tenderness.  Neurological: She is alert and oriented to person, place, and time. No cranial nerve deficit.  Skin: Skin is warm and dry. No rash noted.  Psychiatric: She has a normal mood and affect. Her behavior is normal.  Nursing note and vitals reviewed.   Results for orders placed or performed in visit on 08/01/18  Bayer DCA Hb A1c Waived  Result Value Ref Range   HB A1C (BAYER DCA - WAIVED) 7.1 (H) <7.0 %  Lipid Panel Piccolo, Waived  Result Value Ref Range   Cholesterol Piccolo, Waived 150 <200 mg/dL   HDL Chol Piccolo, Waived 44 (L) >59 mg/dL   Triglycerides Piccolo,Waived 111 <150 mg/dL   Chol/HDL Ratio Piccolo,Waive 3.4 mg/dL   LDL Chol Calc Piccolo Waived 84 <100 mg/dL   VLDL Chol Calc Piccolo,Waive 22 <30 mg/dL  Comprehensive metabolic panel  Result Value Ref Range   Glucose 127 (H) 65 - 99 mg/dL   BUN 15 6 - 24 mg/dL   Creatinine, Ser 0.75 0.57 - 1.00 mg/dL   GFR calc non Af Amer 93 >59 mL/min/1.73   GFR calc Af Amer 107 >59 mL/min/1.73   BUN/Creatinine Ratio 20 9 - 23   Sodium 138 134 - 144 mmol/L   Potassium 5.2 3.5 - 5.2 mmol/L   Chloride 98 96 - 106 mmol/L   CO2 23 20 - 29 mmol/L   Calcium 9.8 8.7 - 10.2 mg/dL   Total Protein 6.5 6.0 - 8.5 g/dL   Albumin 4.3 3.5 - 5.5 g/dL   Globulin, Total 2.2 1.5 - 4.5 g/dL   Albumin/Globulin Ratio 2.0 1.2 - 2.2   Bilirubin Total 0.2 0.0 - 1.2 mg/dL   Alkaline Phosphatase 82 39 - 117 IU/L   AST 15 0 - 40 IU/L   ALT 22 0 - 32 IU/L      Assessment & Plan:   Problem List Items Addressed This Visit      Cardiovascular and Mediastinum   HTN (hypertension)    Stable and WNL, continue current regimen  Endocrine    Uncontrolled type 2 diabetes mellitus ([place]) - Primary    A1C significantly improved, 7.1 now with an excellent 15 lb weight loss. Continue current regimen      Relevant Orders   Bayer DCA Hb A1c Waived (Completed)     Other   Hyperlipidemia    Cholesterol under fairly good control, will continue to monitor as she keeps losing weight. Continue current regimen      Relevant Orders   Lipid Panel Piccolo, Waived (Completed)   Comprehensive metabolic panel (Completed)   Depression    Stable, continue current regimen      Obesity    Already down about 15 pounds with ozempic and portion decreases. Congratulated patient, encouraged continued efforts and addition of exercise          Follow up plan: Return in about 3 months (around 11/01/2018) for CPE.

## 2018-08-02 ENCOUNTER — Telehealth: Payer: Self-pay | Admitting: Gastroenterology

## 2018-08-02 LAB — COMPREHENSIVE METABOLIC PANEL
ALBUMIN: 4.3 g/dL (ref 3.5–5.5)
ALT: 22 IU/L (ref 0–32)
AST: 15 IU/L (ref 0–40)
Albumin/Globulin Ratio: 2 (ref 1.2–2.2)
Alkaline Phosphatase: 82 IU/L (ref 39–117)
BUN/Creatinine Ratio: 20 (ref 9–23)
BUN: 15 mg/dL (ref 6–24)
Bilirubin Total: 0.2 mg/dL (ref 0.0–1.2)
CALCIUM: 9.8 mg/dL (ref 8.7–10.2)
CO2: 23 mmol/L (ref 20–29)
CREATININE: 0.75 mg/dL (ref 0.57–1.00)
Chloride: 98 mmol/L (ref 96–106)
GFR calc Af Amer: 107 mL/min/{1.73_m2} (ref >59)
GFR, EST NON AFRICAN AMERICAN: 93 mL/min/{1.73_m2} (ref >59)
GLOBULIN, TOTAL: 2.2 g/dL (ref 1.5–4.5)
GLUCOSE: 127 mg/dL — AB (ref 65–99)
Potassium: 5.2 mmol/L (ref 3.5–5.2)
SODIUM: 138 mmol/L (ref 134–144)
Total Protein: 6.5 g/dL (ref 6.0–8.5)

## 2018-08-02 NOTE — Telephone Encounter (Signed)
Pt left vm for results on capsule study she states it has been 2 weeks and has not heard anything she also tried leaving a message through the portal

## 2018-08-03 NOTE — Telephone Encounter (Signed)
Did you happen to have her report?

## 2018-08-03 NOTE — Assessment & Plan Note (Signed)
Cholesterol under fairly good control, will continue to monitor as she keeps losing weight. Continue current regimen

## 2018-08-03 NOTE — Assessment & Plan Note (Signed)
A1C significantly improved, 7.1 now with an excellent 15 lb weight loss. Continue current regimen

## 2018-08-03 NOTE — Telephone Encounter (Signed)
Pt is returning your call

## 2018-08-03 NOTE — Assessment & Plan Note (Signed)
Stable and WNL, continue current regimen 

## 2018-08-03 NOTE — Patient Instructions (Addendum)
Follow up for CPE 

## 2018-08-03 NOTE — Assessment & Plan Note (Signed)
Stable, continue current regimen 

## 2018-08-03 NOTE — Assessment & Plan Note (Signed)
Already down about 15 pounds with ozempic and portion decreases. Congratulated patient, encouraged continued efforts and addition of exercise

## 2018-08-13 ENCOUNTER — Other Ambulatory Visit: Payer: Self-pay | Admitting: Family Medicine

## 2018-08-24 ENCOUNTER — Other Ambulatory Visit: Payer: Commercial Managed Care - PPO

## 2018-08-24 ENCOUNTER — Ambulatory Visit: Payer: Commercial Managed Care - PPO | Admitting: Oncology

## 2018-08-25 ENCOUNTER — Other Ambulatory Visit: Payer: Self-pay

## 2018-08-25 ENCOUNTER — Encounter: Payer: Self-pay | Admitting: Family Medicine

## 2018-08-25 ENCOUNTER — Ambulatory Visit (INDEPENDENT_AMBULATORY_CARE_PROVIDER_SITE_OTHER): Payer: Commercial Managed Care - PPO | Admitting: Family Medicine

## 2018-08-25 VITALS — BP 129/81 | HR 69 | Temp 97.7°F | Ht 62.0 in | Wt 278.5 lb

## 2018-08-25 DIAGNOSIS — Z1329 Encounter for screening for other suspected endocrine disorder: Secondary | ICD-10-CM | POA: Diagnosis not present

## 2018-08-25 DIAGNOSIS — I10 Essential (primary) hypertension: Secondary | ICD-10-CM

## 2018-08-25 DIAGNOSIS — Z0000 Encounter for general adult medical examination without abnormal findings: Secondary | ICD-10-CM | POA: Diagnosis not present

## 2018-08-25 DIAGNOSIS — E1165 Type 2 diabetes mellitus with hyperglycemia: Secondary | ICD-10-CM

## 2018-08-25 DIAGNOSIS — E782 Mixed hyperlipidemia: Secondary | ICD-10-CM | POA: Diagnosis not present

## 2018-08-25 DIAGNOSIS — Z6841 Body Mass Index (BMI) 40.0 and over, adult: Secondary | ICD-10-CM

## 2018-08-25 DIAGNOSIS — Z1239 Encounter for other screening for malignant neoplasm of breast: Secondary | ICD-10-CM

## 2018-08-25 DIAGNOSIS — Z1231 Encounter for screening mammogram for malignant neoplasm of breast: Secondary | ICD-10-CM | POA: Diagnosis not present

## 2018-08-25 LAB — MICROSCOPIC EXAMINATION

## 2018-08-25 LAB — UA/M W/RFLX CULTURE, ROUTINE
Bilirubin, UA: NEGATIVE
Ketones, UA: NEGATIVE
LEUKOCYTES UA: NEGATIVE
Nitrite, UA: NEGATIVE
PH UA: 6 (ref 5.0–7.5)
PROTEIN UA: NEGATIVE
SPEC GRAV UA: 1.01 (ref 1.005–1.030)
Urobilinogen, Ur: 0.2 mg/dL (ref 0.2–1.0)

## 2018-08-25 NOTE — Progress Notes (Signed)
BP 129/81   Pulse 69   Temp 97.7 F (36.5 C) (Oral)   Ht 5\' 2"  (1.575 m)   Wt 278 lb 8 oz (126.3 kg)   SpO2 97%   BMI 50.94 kg/m    Subjective:    Patient ID: Eileen Ritter, female    DOB: 1966-04-07, 52 y.o.   MRN: [id number]  HPI: Eileen Ritter is a 52 y.o. female presenting on 08/25/2018 for comprehensive medical examination. Current medical complaints include:see below  Checking BS at home, 100-140s. Tolerating ozempic better now and maintaining her prior weight loss since starting. States her portion sizes have been less since being on it. No low blood sugar spells. Also on invokana and metformin.    BPs under good control when checked at home. Compliant with medications, no side effects. Taking lipitor for chol management, no myalgias, CP, SOB, claudication.   Breast and eye exam usually is every October. Pt will schedule these.   Depression Screen done today and results listed below:  Depression screen [facility] 2/9 08/25/2018 08/01/2018 05/03/2018 01/28/2018 10/27/2017  Decreased Interest 0 0 0 0 0  Down, Depressed, Hopeless 0 1 0 0 1  PHQ - 2 Score 0 1 0 0 1  Altered sleeping 1 0 0 0 0  Tired, decreased energy 1 0 0 0 0  Change in appetite 0 0 1 2 1   Feeling bad or failure about yourself  0 0 0 0 0  Trouble concentrating 0 0 0 1 0  Moving slowly or fidgety/restless 0 0 0 0 0  Suicidal thoughts 0 0 0 0 0  PHQ-9 Score 2 1 1 3 2     The patient does not have a history of falls. I did not complete a risk assessment for falls. A plan of care for falls was not documented.   Past Medical History:  Past Medical History:  Diagnosis Date  . Allergic rhinitis   . Anxiety   . Chronic kidney disease   . Depression   . Diabetes ([place])   . GERD (gastroesophageal reflux disease)   . HBP (high blood pressure)   . High cholesterol   . Iron deficiency anemia due to chronic blood loss 01/13/2018  . Obesity     Surgical History:  Past Surgical History:  Procedure  Laterality Date  . APPENDECTOMY    . COLONOSCOPY WITH PROPOFOL N/A 09/23/2017   Procedure: COLONOSCOPY WITH PROPOFOL;  Surgeon: Jonathon Bellows, MD;  Location: [facility] ENDOSCOPY;  Service: Gastroenterology;  Laterality: N/A;  . CYST EXCISION     back  . ESOPHAGOGASTRODUODENOSCOPY (EGD) WITH PROPOFOL N/A 09/23/2017   Procedure: ESOPHAGOGASTRODUODENOSCOPY (EGD) WITH PROPOFOL;  Surgeon: Jonathon Bellows, MD;  Location: [facility] ENDOSCOPY;  Service: Gastroenterology;  Laterality: N/A;  . ESOPHAGOGASTRODUODENOSCOPY (EGD) WITH PROPOFOL N/A 06/13/2018   Procedure: ESOPHAGOGASTRODUODENOSCOPY (EGD) WITH PROPOFOL;  Surgeon: Jonathon Bellows, MD;  Location[facility] ENDOSCOPY;  Service: Gastroenterology;  Laterality: N/A;  . GIVENS CAPSULE STUDY N/A 07/20/2018   Procedure: GIVENS CAPSULE STUDY;  Surgeon: Jonathon Bellows, MD;  Location: [facility] ENDOSCOPY;  Service: Gastroenterology;  Laterality: N/A;  . TONSILLECTOMY    . TUBAL LIGATION      Medications:  Current Outpatient Medications on File Prior to Visit  Medication Sig  . Ascorbic Acid (VITAMIN C) 100 MG tablet Take 100 mg by mouth daily.  Marland Kitchen atorvastatin (LIPITOR) 40 MG tablet TAKE 1 TABLET BY MOUTH EVERY DAY  . buPROPion (WELLBUTRIN SR) 150 MG 12 hr tablet TAKE 1 TABLET BY  MOUTH EVERY DAY  . Cholecalciferol (VITAMIN D) 2000 units tablet Take 2,000 Units by mouth daily.  . cyclobenzaprine (FLEXERIL) 10 MG tablet Take 1 tablet (10 mg total) by mouth as needed for muscle spasms.  . diclofenac sodium (VOLTAREN) 1 % GEL Apply 4 g topically 4 (four) times daily.  . Ferrous Sulfate Dried (SLOW RELEASE IRON) 45 MG TBCR Take 45 mg by mouth daily.  Marland Kitchen FLUoxetine (PROZAC) 20 MG tablet TAKE 1 TABLET (20 MG TOTAL) BY MOUTH AT BEDTIME.  Marland Kitchen Fluoxetine HCl, PMDD, 20 MG TABS TAKE 1 TABLET BY MOUTH EVERYDAY AT BEDTIME  . fluticasone (FLONASE) 50 MCG/ACT nasal spray Place 2 sprays into both nostrils daily.  . INVOKANA 300 MG TABS tablet TAKE 1 TABLET (300 MG TOTAL) BY MOUTH DAILY BEFORE BREAKFAST.    Marland Kitchen losartan (COZAAR) 50 MG tablet TAKE 1 TABLET BY MOUTH EVERY DAY  . metFORMIN (GLUCOPHAGE) 500 MG tablet TAKE 2 TABLETS BY MOUTH TWICE A DAY WITH A MEAL  . omeprazole (PRILOSEC) 20 MG capsule TAKE 1 CAPSULE BY MOUTH EVERY DAY  . ondansetron (ZOFRAN) 4 MG tablet Take 1 tablet (4 mg total) by mouth every 8 (eight) hours as needed for nausea or vomiting.  . Semaglutide (OZEMPIC) 0.25 or 0.5 MG/DOSE SOPN Inject 0.5 mg into the skin once a week.  . vitamin B-12 (CYANOCOBALAMIN) 100 MCG tablet Take 100 mcg by mouth daily.   No current facility-administered medications on file prior to visit.     Allergies:  No Known Allergies  Social History:  Social History   Socioeconomic History  . Marital status: Single    Spouse name: Not on file  . Number of children: Not on file  . Years of education: Not on file  . Highest education level: Not on file  Occupational History  . Not on file  Social Needs  . Financial resource strain: Not on file  . Food insecurity:    Worry: Not on file    Inability: Not on file  . Transportation needs:    Medical: Not on file    Non-medical: Not on file  Tobacco Use  . Smoking status: Former Smoker    Last attempt to quit: 11/01/2013    Years since quitting: 4.8  . Smokeless tobacco: Never Used  Substance and Sexual Activity  . Alcohol use: No  . Drug use: No  . Sexual activity: Yes  Lifestyle  . Physical activity:    Days per week: Not on file    Minutes per session: Not on file  . Stress: Not on file  Relationships  . Social connections:    Talks on phone: Not on file    Gets together: Not on file    Attends religious service: Not on file    Active member of club or organization: Not on file    Attends meetings of clubs or organizations: Not on file    Relationship status: Not on file  . Intimate partner violence:    Fear of current or ex partner: Not on file    Emotionally abused: Not on file    Physically abused: Not on file    Forced  sexual activity: Not on file  Other Topics Concern  . Not on file  Social History Narrative  . Not on file   Social History   Tobacco Use  Smoking Status Former Smoker  . Last attempt to quit: 11/01/2013  . Years since quitting: 4.8  Smokeless Tobacco Never Used  Social History   Substance and Sexual Activity  Alcohol Use No    Family History:  Family History  Problem Relation Age of Onset  . Diabetes Mother   . Heart disease Mother   . Hypertension Mother   . Stroke Mother   . Cancer Mother        LUNG  . Mental illness Mother   . Thyroid disease Mother   . Diabetes Father   . Heart disease Father   . Diabetes Sister   . Diabetes Brother   . Heart disease Brother   . Heart disease Maternal Grandmother   . Breast cancer Neg Hx     Past medical history, surgical history, medications, allergies, family history and social history reviewed with patient today and changes made to appropriate areas of the chart.   Review of Systems - General ROS: negative Psychological ROS: negative Ophthalmic ROS: negative ENT ROS: negative Allergy and Immunology ROS: negative Hematological and Lymphatic ROS: negative Endocrine ROS: negative Breast ROS: negative for breast lumps Respiratory ROS: no cough, shortness of breath, or wheezing Cardiovascular ROS: no chest pain or dyspnea on exertion Gastrointestinal ROS: no abdominal pain, change in bowel habits, or black or bloody stools Genito-Urinary ROS: no dysuria, trouble voiding, or hematuria Musculoskeletal ROS: negative Neurological ROS: no TIA or stroke symptoms Dermatological ROS: negative All other ROS negative except what is listed above and in the HPI.      Objective:    BP 129/81   Pulse 69   Temp 97.7 F (36.5 C) (Oral)   Ht 5\' 2"  (1.575 m)   Wt 278 lb 8 oz (126.3 kg)   SpO2 97%   BMI 50.94 kg/m   Wt Readings from Last 3 Encounters:  08/25/18 278 lb 8 oz (126.3 kg)  08/01/18 278 lb (126.1 kg)  07/13/18  282 lb 3.2 oz (128 kg)    Physical Exam  Constitutional: She is oriented to person, place, and time. She appears well-developed. No distress.  obese  HENT:  Head: Atraumatic.  Right Ear: External ear normal.  Left Ear: External ear normal.  Nose: Nose normal.  Mouth/Throat: Oropharynx is clear and moist. No oropharyngeal exudate.  Eyes: Pupils are equal, round, and reactive to light. Conjunctivae are normal. No scleral icterus.  Neck: Normal range of motion. Neck supple. No thyromegaly present.  Cardiovascular: Normal rate, regular rhythm, normal heart sounds and intact distal pulses.  Pulmonary/Chest: Effort normal and breath sounds normal. No respiratory distress. Right breast exhibits no mass, no skin change and no tenderness. Left breast exhibits no mass, no skin change and no tenderness.  Abdominal: Soft. Bowel sounds are normal. She exhibits no mass. There is no tenderness.  Musculoskeletal: Normal range of motion. She exhibits no edema or tenderness.  Lymphadenopathy:    She has no cervical adenopathy.    She has no axillary adenopathy.  Neurological: She is alert and oriented to person, place, and time. No cranial nerve deficit.  Skin: Skin is warm and dry. No rash noted.  Psychiatric: She has a normal mood and affect. Her behavior is normal.  Nursing note and vitals reviewed.   Results for orders placed or performed in visit on 08/25/18  Microscopic Examination  Result Value Ref Range   WBC, UA 0-5 0 - 5 /hpf   RBC, UA 0-2 0 - 2 /hpf   Epithelial Cells (non renal) 0-10 0 - 10 /hpf   Renal Epithel, UA 0-10 (A) None seen /hpf   Bacteria,  UA Few None seen/Few   Yeast, UA Present None seen  CBC with Differential/Platelet  Result Value Ref Range   WBC 10.7 3.4 - 10.8 x10E3/uL   RBC 5.44 (H) 3.77 - 5.28 x10E6/uL   Hemoglobin 14.2 11.1 - 15.9 g/dL   Hematocrit 43.8 34.0 - 46.6 %   MCV 81 79 - 97 fL   MCH 26.1 (L) 26.6 - 33.0 pg   MCHC 32.4 31.5 - 35.7 g/dL   RDW 13.8  12.3 - 15.4 %   Platelets 384 150 - 450 x10E3/uL   Neutrophils 55 Not Estab. %   Lymphs 34 Not Estab. %   Monocytes 8 Not Estab. %   Eos 2 Not Estab. %   Basos 1 Not Estab. %   Neutrophils Absolute 5.9 1.4 - 7.0 x10E3/uL   Lymphocytes Absolute 3.7 (H) 0.7 - 3.1 x10E3/uL   Monocytes Absolute 0.8 0.1 - 0.9 x10E3/uL   EOS (ABSOLUTE) 0.2 0.0 - 0.4 x10E3/uL   Basophils Absolute 0.1 0.0 - 0.2 x10E3/uL   Immature Granulocytes 0 Not Estab. %   Immature Grans (Abs) 0.0 0.0 - 0.1 x10E3/uL  Comprehensive metabolic panel  Result Value Ref Range   Glucose 136 (H) 65 - 99 mg/dL   BUN 11 6 - 24 mg/dL   Creatinine, Ser 0.79 0.57 - 1.00 mg/dL   GFR calc non Af Amer 88 >59 mL/min/1.73   GFR calc Af Amer 101 >59 mL/min/1.73   BUN/Creatinine Ratio 14 9 - 23   Sodium 142 134 - 144 mmol/L   Potassium 4.4 3.5 - 5.2 mmol/L   Chloride 99 96 - 106 mmol/L   CO2 24 20 - 29 mmol/L   Calcium 9.6 8.7 - 10.2 mg/dL   Total Protein 6.6 6.0 - 8.5 g/dL   Albumin 4.5 3.5 - 5.5 g/dL   Globulin, Total 2.1 1.5 - 4.5 g/dL   Albumin/Globulin Ratio 2.1 1.2 - 2.2   Bilirubin Total 0.2 0.0 - 1.2 mg/dL   Alkaline Phosphatase 81 39 - 117 IU/L   AST 16 0 - 40 IU/L   ALT 27 0 - 32 IU/L  Lipid Panel w/o Chol/HDL Ratio  Result Value Ref Range   Cholesterol, Total 144 100 - 199 mg/dL   Triglycerides 129 0 - 149 mg/dL   HDL 43 >39 mg/dL   VLDL Cholesterol Cal 26 5 - 40 mg/dL   LDL Calculated 75 0 - 99 mg/dL  TSH  Result Value Ref Range   TSH 3.870 0.450 - 4.500 uIU/mL  UA/M w/rflx Culture, Routine  Result Value Ref Range   Specific Gravity, UA 1.010 1.005 - 1.030   pH, UA 6.0 5.0 - 7.5   Color, UA Yellow Yellow   Appearance Ur Hazy (A) Clear   Leukocytes, UA Negative Negative   Protein, UA Negative Negative/Trace   Glucose, UA 3+ (A) Negative   Ketones, UA Negative Negative   RBC, UA Trace (A) Negative   Bilirubin, UA Negative Negative   Urobilinogen, Ur 0.2 0.2 - 1.0 mg/dL   Nitrite, UA Negative Negative    Microscopic Examination See below:   HgB A1c  Result Value Ref Range   Hgb A1c MFr Bld 7.4 (H) 4.8 - 5.6 %   Est. average glucose Bld gHb Est-mCnc 166 mg/dL      Assessment & Plan:   Problem List Items Addressed This Visit      Cardiovascular and Mediastinum   HTN (hypertension) - Primary    Stable and WNL, continue  current regimen      Relevant Orders   CBC with Differential/Platelet (Completed)     Endocrine   Uncontrolled type 2 diabetes mellitus ([place])    Recheck A1C, tolerating current regimen well and working on making dietary changes. Adjust if needed      Relevant Orders   UA/M w/rflx Culture, Routine (Completed)   HgB A1c (Completed)     Other   Hyperlipidemia    Recheck lipids, adjust as needed. Continue lipitor and lifestyle modifications      Relevant Orders   Comprehensive metabolic panel (Completed)   Lipid Panel w/o Chol/HDL Ratio (Completed)   Obesity    Continue working on portion control and reduction of carbs. Exercise reviewed       Other Visit Diagnoses    Annual physical exam       Screening for breast cancer       Relevant Orders   MM DIGITAL SCREENING BILATERAL   Screening for thyroid disorder       Relevant Orders   TSH (Completed)       Follow up plan: Return in about 3 months (around 11/24/2018) for A1C.   LABORATORY TESTING:  - Pap smear: up to date  IMMUNIZATIONS:   - Tdap: Tetanus vaccination status reviewed: last tetanus booster within 10 years. - Influenza: gets through work every fall - Pneumovax: Up to date  SCREENING: -Mammogram: Ordered today  - Colonoscopy: Up to date   PATIENT COUNSELING:   Advised to take 1 mg of folate supplement per day if capable of pregnancy.   Sexuality: Discussed sexually transmitted diseases, partner selection, use of condoms, avoidance of unintended pregnancy  and contraceptive alternatives.   Advised to avoid cigarette smoking.  I discussed with the patient that most people  either abstain from alcohol or drink within safe limits (<=14/week and <=4 drinks/occasion for males, <=7/weeks and <= 3 drinks/occasion for females) and that the risk for alcohol disorders and other health effects rises proportionally with the number of drinks per week and how often a drinker exceeds daily limits.  Discussed cessation/primary prevention of drug use and availability of treatment for abuse.   Diet: Encouraged to adjust caloric intake to maintain  or achieve ideal body weight, to reduce intake of dietary saturated fat and total fat, to limit sodium intake by avoiding high sodium foods and not adding table salt, and to maintain adequate dietary potassium and calcium preferably from fresh fruits, vegetables, and low-fat dairy products.    stressed the importance of regular exercise  Injury prevention: Discussed safety belts, safety helmets, smoke detector, smoking near bedding or upholstery.   Dental health: Discussed importance of regular tooth brushing, flossing, and dental visits.    NEXT PREVENTATIVE PHYSICAL DUE IN 1 YEAR. Return in about 3 months (around 11/24/2018) for A1C.

## 2018-08-26 ENCOUNTER — Telehealth: Payer: Self-pay

## 2018-08-26 ENCOUNTER — Encounter: Payer: Commercial Managed Care - PPO | Admitting: Family Medicine

## 2018-08-26 LAB — COMPREHENSIVE METABOLIC PANEL
ALT: 27 IU/L (ref 0–32)
AST: 16 IU/L (ref 0–40)
Albumin/Globulin Ratio: 2.1 (ref 1.2–2.2)
Albumin: 4.5 g/dL (ref 3.5–5.5)
Alkaline Phosphatase: 81 IU/L (ref 39–117)
BUN/Creatinine Ratio: 14 (ref 9–23)
BUN: 11 mg/dL (ref 6–24)
Bilirubin Total: 0.2 mg/dL (ref 0.0–1.2)
CALCIUM: 9.6 mg/dL (ref 8.7–10.2)
CO2: 24 mmol/L (ref 20–29)
Chloride: 99 mmol/L (ref 96–106)
Creatinine, Ser: 0.79 mg/dL (ref 0.57–1.00)
GFR calc Af Amer: 101 mL/min/{1.73_m2} (ref >59)
GFR, EST NON AFRICAN AMERICAN: 88 mL/min/{1.73_m2} (ref >59)
GLOBULIN, TOTAL: 2.1 g/dL (ref 1.5–4.5)
Glucose: 136 mg/dL — ABNORMAL HIGH (ref 65–99)
Potassium: 4.4 mmol/L (ref 3.5–5.2)
Sodium: 142 mmol/L (ref 134–144)
Total Protein: 6.6 g/dL (ref 6.0–8.5)

## 2018-08-26 LAB — CBC WITH DIFFERENTIAL/PLATELET
BASOS ABS: 0.1 10*3/uL (ref 0.0–0.2)
Basos: 1 %
EOS (ABSOLUTE): 0.2 10*3/uL (ref 0.0–0.4)
EOS: 2 %
HEMATOCRIT: 43.8 % (ref 34.0–46.6)
Hemoglobin: 14.2 g/dL (ref 11.1–15.9)
IMMATURE GRANULOCYTES: 0 %
Immature Grans (Abs): 0 10*3/uL (ref 0.0–0.1)
Lymphocytes Absolute: 3.7 10*3/uL — ABNORMAL HIGH (ref 0.7–3.1)
Lymphs: 34 %
MCH: 26.1 pg — ABNORMAL LOW (ref 26.6–33.0)
MCHC: 32.4 g/dL (ref 31.5–35.7)
MCV: 81 fL (ref 79–97)
MONOS ABS: 0.8 10*3/uL (ref 0.1–0.9)
Monocytes: 8 %
NEUTROS PCT: 55 %
Neutrophils Absolute: 5.9 10*3/uL (ref 1.4–7.0)
PLATELETS: 384 10*3/uL (ref 150–450)
RBC: 5.44 x10E6/uL — AB (ref 3.77–5.28)
RDW: 13.8 % (ref 12.3–15.4)
WBC: 10.7 10*3/uL (ref 3.4–10.8)

## 2018-08-26 LAB — LIPID PANEL W/O CHOL/HDL RATIO
CHOLESTEROL TOTAL: 144 mg/dL (ref 100–199)
HDL: 43 mg/dL (ref >39)
LDL CALC: 75 mg/dL (ref 0–99)
TRIGLYCERIDES: 129 mg/dL (ref 0–149)
VLDL CHOLESTEROL CAL: 26 mg/dL (ref 5–40)

## 2018-08-26 LAB — TSH: TSH: 3.87 u[IU]/mL (ref 0.450–4.500)

## 2018-08-26 LAB — HEMOGLOBIN A1C
ESTIMATED AVERAGE GLUCOSE: 166 mg/dL
Hgb A1c MFr Bld: 7.4 % — ABNORMAL HIGH (ref 4.8–5.6)

## 2018-08-26 NOTE — Telephone Encounter (Signed)
Spoke to patient and she stated if we can fax the Physical form to [phone]. Advised pt that Apolonio Schneiders is not in the office today so we will try to fax it on Monday. Pt verbalized understanding.

## 2018-08-30 NOTE — Assessment & Plan Note (Signed)
Stable and WNL, continue current regimen 

## 2018-08-30 NOTE — Assessment & Plan Note (Signed)
Continue working on portion control and reduction of carbs. Exercise reviewed

## 2018-08-30 NOTE — Assessment & Plan Note (Signed)
Recheck lipids, adjust as needed. Continue lipitor and lifestyle modifications. 

## 2018-08-30 NOTE — Assessment & Plan Note (Signed)
Recheck A1C, tolerating current regimen well and working on making dietary changes. Adjust if needed

## 2018-08-30 NOTE — Patient Instructions (Signed)
Follow up in 3 months

## 2018-09-11 IMAGING — CT NM PET NOPR SKULL BASE TO THIGH
1 of 10 series · 1 of 25 positions shown · non-contrast
Comparison: CT AP 07/22/2013

CLINICAL DATA: Neuroendocrine tumor of GI tract.

EXAM:
NUCLEAR MEDICINE PET SKULL BASE TO THIGH
TECHNIQUE: 5.9 mCi Ga 68 DOTATATE was injected intravenously. Full-ring PET
imaging was performed from the skull base to thigh after the
radiotracer. CT data was obtained and used for attenuation
correction and anatomic localization.

[Series 3: ct wb 5.0 b30f · axial · 5.0mm · 0.98mm/px · 1 of 290 slices shown]
[im 290/290  brain]
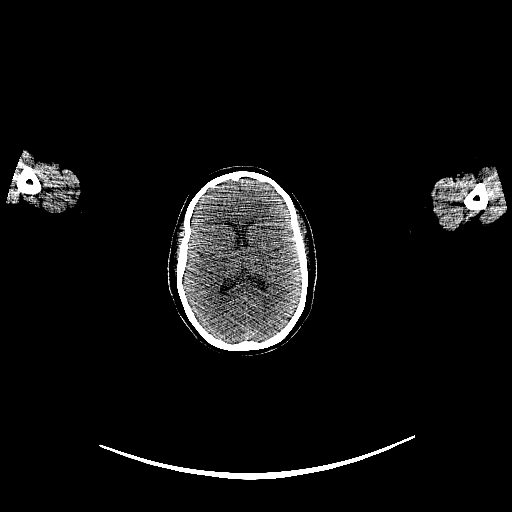

[1 of 25 positions shown; findings below may reference images not displayed]

FINDINGS: NECK

No radiotracer activity in neck lymph nodes.

CHEST

No radiotracer accumulation within mediastinal or hilar lymph nodes.
No suspicious pulmonary nodules on the CT scan.

ABDOMEN/PELVIS

Physiologic activity noted in the liver, spleen, adrenal glands and
kidneys. Aortic atherosclerosis noted.

Right adrenal gland adenoma measures 2.2 cm.

SKELETON

No focal activity to suggest skeletal metastasis. Spondylosis noted
within the lumbar spine.
IMPRESSION: 1. No evidence for Ya-D5 DOTATATE avid tumor.
2.  Aortic Atherosclerosis ([id number]-GO2.2).
3. Right adrenal gland adenoma.

## 2018-09-12 ENCOUNTER — Other Ambulatory Visit: Payer: Self-pay | Admitting: Family Medicine

## 2018-09-12 MED ORDER — LOSARTAN POTASSIUM 50 MG PO TABS: 50.0000 mg | ORAL_TABLET | Freq: Every day | ORAL | 1 refills | Status: DC

## 2018-09-12 NOTE — Addendum Note (Signed)
Addended by: Valerie Roys on: 09/12/2018 08:51 AM   Modules accepted: Orders

## 2018-10-01 ENCOUNTER — Other Ambulatory Visit: Payer: Self-pay | Admitting: Family Medicine

## 2018-10-03 NOTE — Telephone Encounter (Signed)
Requested Prescriptions  Pending Prescriptions Disp Refills  . metFORMIN (GLUCOPHAGE) 500 MG tablet [Pharmacy Med Name: METFORMIN HCL 500 MG TABLET] 360 tablet 3    Sig: TAKE 2 TABLETS BY MOUTH TWICE A DAY WITH A MEAL     Endocrinology:  Diabetes - Biguanides Passed - 10/01/2018  8:51 AM      Passed - Cr in normal range and within 360 days    Creatinine  Date Value Ref Range Status  07/22/2013 0.89 0.60 - 1.30 mg/dL Final   Creatinine, Ser  Date Value Ref Range Status  08/25/2018 0.79 0.57 - 1.00 mg/dL Final         Passed - HBA1C is between 0 and 7.9 and within 180 days    Hemoglobin A1C  Date Value Ref Range Status  10/26/2016 6.9%  Final   Hgb A1c MFr Bld  Date Value Ref Range Status  08/25/2018 7.4 (H) 4.8 - 5.6 % Final    Comment:             Prediabetes: 5.7 - 6.4          Diabetes: >6.4          Glycemic control for adults with diabetes: <7.0          Passed - eGFR in normal range and within 360 days    EGFR (African American)  Date Value Ref Range Status  07/22/2013 >60  Final   GFR calc Af Amer  Date Value Ref Range Status  08/25/2018 101 >59 mL/min/1.73 Final   EGFR (Non-African Amer.)  Date Value Ref Range Status  07/22/2013 >60  Final    Comment:    eGFR values <76m/min/1.73 m2 may be an indication of chronic kidney disease (CKD). Calculated eGFR is useful in patients with stable renal function. The eGFR calculation will not be reliable in acutely ill patients when serum creatinine is changing rapidly. It is not useful in  patients on dialysis. The eGFR calculation may not be applicable to patients at the low and high extremes of body sizes, pregnant women, and vegetarians.    GFR calc non Af Amer  Date Value Ref Range Status  08/25/2018 88 >59 mL/min/1.73 Final         Passed - Valid encounter within last 6 months    Recent Outpatient Visits          1 month ago [facility]ential [facility]   2 months ago Uncontrolled type 2 diabetes mellitus with hyperglycemia ([facility]  3 months ago [place]controlled [place]   [facility]   5 months ago Type 2 diabetes mellitus with stage 3 chronic kidney disease, [place] long[place]term [place] use of insulin [place]   [facility]   8 months ago Type 2 diabetes mellitus with chronic kidney disease, without long-term current use of insulin, unspecified CKD stage ([facility]   [facility] [place]

## 2018-10-14 ENCOUNTER — Encounter: Payer: Self-pay | Admitting: Gastroenterology

## 2018-10-20 ENCOUNTER — Inpatient Hospital Stay: Payer: Commercial Managed Care - PPO | Admitting: Oncology

## 2018-10-20 ENCOUNTER — Telehealth: Payer: Self-pay | Admitting: Oncology

## 2018-10-20 NOTE — Telephone Encounter (Signed)
Patient NO SHOW for 10/20/18 MD appt. Called patient to rschd appt.  Left msg on V/M to rschd appt.

## 2018-10-25 ENCOUNTER — Ambulatory Visit
Admission: RE | Admit: 2018-10-25 | Discharge: 2018-10-25 | Disposition: A | Discharge status: Home / Self Care | Payer: Commercial Managed Care - PPO | Source: Ambulatory Visit | Attending: Family Medicine | Admitting: Family Medicine

## 2018-10-25 DIAGNOSIS — Z1239 Encounter for other screening for malignant neoplasm of breast: Secondary | ICD-10-CM | POA: Diagnosis not present

## 2018-10-28 ENCOUNTER — Inpatient Hospital Stay: Payer: Commercial Managed Care - PPO | Attending: Oncology | Admitting: Oncology

## 2018-10-28 ENCOUNTER — Other Ambulatory Visit: Payer: Self-pay | Admitting: Physician Assistant

## 2018-10-28 ENCOUNTER — Other Ambulatory Visit: Payer: Self-pay

## 2018-10-28 ENCOUNTER — Encounter: Payer: Self-pay | Admitting: Oncology

## 2018-10-28 VITALS — BP 128/86 | HR 72 | Temp 97.5°F | Resp 18 | Wt 280.8 lb

## 2018-10-28 DIAGNOSIS — C7A010 Malignant carcinoid tumor of the duodenum: Secondary | ICD-10-CM | POA: Insufficient documentation

## 2018-10-28 DIAGNOSIS — C7A8 Other malignant neuroendocrine tumors: Secondary | ICD-10-CM

## 2018-10-28 DIAGNOSIS — D509 Iron deficiency anemia, unspecified: Secondary | ICD-10-CM | POA: Diagnosis not present

## 2018-10-28 NOTE — Progress Notes (Signed)
Patient here for follow up. No concerns voiced.  °

## 2018-10-28 NOTE — Progress Notes (Signed)
Hematology/Oncology  Follow up note [facility] [phone] [phone]   Patient Care Team: Volney American, PA-C as PCP - General (Family Medicine)  REFERRING PROVIDER: Nilsa Nutting REASON FOR VISIT Follow up for management of  neuroendocrine carcinoma  HISTORY OF PRESENTING ILLNESS:  Eileen Ritter is a  52 y.o.  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 Patient was referred to see gastroenterologist Dr. Vicente Males for evaluation of iron deficiency anemia.  Patient underwent EGD and a colonoscopy on September 23, 2017.  Colonoscopy revealed tubular adenoma x1 status post resection.  EGD showed polypoid mass seen in the duodenum.  No biopsy was taken patient was referred to [facility] for EMR versus ESD. Patient underwent EUS at [facility] on December 01, 2017.  The duodenum polyp/mass was submucosal on EUS, about 1 cm.  A band was placed at the base of the polyp, biopsy was taken from the polyp which demonstrated well-differentiated grade 1 neuroendocrine carcinoma.  Patient was told that the mass is expected to drop off and she has planned for repeat EGD at [facility] in about 6 months. Patient takes ferrous sulfate slow release 45 mg daily.  She also takes B12 100 mcg by mouth daily. Today she feels that fatigue has getting better after starting iron supplementation. She has intermittent hot flushes which she attributes to going through menopausal.  She denies any diarrhea or abdominal pain..   Patient is perimenopausal with heavy menstrual bleeding history, and endograft regular cycles lately.  Last period was during last December.  # Dr. Vicente Males repeated EGD on 06/13/2018 which showed a single 10 mm sessile polyp with no bleeding, found in the duodenal bulb.  No biopsy was taken patient was referred back to [facility].  For repeat attempt of resecting duodenal polyp.   INTERVAL HISTORY Eileen Ritter is a 52 y.o. female who has above history reviewed by me today presents for follow up visit for duodenum  well-differentiated grade 1 neuroendocrine carcinoma.  During the interval, she has had upper endoscopy at [facility], endoscopy showed duodenum submucosal nodule, banded.  She reports feeling well today. No new complaints. No abdominal pain.   Review of Systems  Constitutional: Negative for malaise/fatigue and weight loss.  HENT: Negative for nosebleeds.   Eyes: Negative for double vision, photophobia and redness.  Respiratory: Negative for cough.   Cardiovascular: Negative for chest pain, palpitations and orthopnea.  Gastrointestinal: Negative for abdominal pain, blood in stool, nausea and vomiting.  Genitourinary: Negative for dysuria.  Musculoskeletal: Negative for back pain, myalgias and neck pain.  Skin: Negative for itching and rash.  Neurological: Negative for dizziness, tingling and tremors.  Endo/Heme/Allergies: Negative for environmental allergies. Does not bruise/bleed easily.  Psychiatric/Behavioral: Negative for depression.     MEDICAL HISTORY:  Past Medical History:  Diagnosis Date  . Allergic rhinitis   . Anxiety   . Chronic kidney disease   . Depression   . Diabetes ([place])   . GERD (gastroesophageal reflux disease)   . HBP (high blood pressure)   . High cholesterol   . Iron deficiency anemia due to chronic blood loss 01/13/2018  . Obesity     SURGICAL HISTORY: Past Surgical History:  Procedure Laterality Date  . APPENDECTOMY    . COLONOSCOPY WITH PROPOFOL N/A 09/23/2017   Procedure: COLONOSCOPY WITH PROPOFOL;  Surgeon: Jonathon Bellows, MD;  Location[facility] ENDOSCOPY;  Service: Gastroenterology;  Laterality: N/A;  .  CYST EXCISION     back  . ESOPHAGOGASTRODUODENOSCOPY (EGD) WITH PROPOFOL N/A 09/23/2017   Procedure: ESOPHAGOGASTRODUODENOSCOPY (EGD) WITH PROPOFOL;  Surgeon: Jonathon Bellows, MD;  Location: [facility] ENDOSCOPY;  Service: Gastroenterology;   Laterality: N/A;  . ESOPHAGOGASTRODUODENOSCOPY (EGD) WITH PROPOFOL N/A 06/13/2018   Procedure: ESOPHAGOGASTRODUODENOSCOPY (EGD) WITH PROPOFOL;  Surgeon: Jonathon Bellows, MD;  Location: [facility] ENDOSCOPY;  Service: Gastroenterology;  Laterality: N/A;  . GIVENS CAPSULE STUDY N/A 07/20/2018   Procedure: GIVENS CAPSULE STUDY;  Surgeon: Jonathon Bellows, MD;  Location[facility] ENDOSCOPY;  Service: Gastroenterology;  Laterality: N/A;  . TONSILLECTOMY    . TUBAL LIGATION      SOCIAL HISTORY: Social History   Socioeconomic History  . Marital status: Single    Spouse name: Not on file  . Number of children: Not on file  . Years of education: Not on file  . Highest education level: Not on file  Occupational History  . Not on file  Social Needs  . Financial resource strain: Not on file  . Food insecurity:    Worry: Not on file    Inability: Not on file  . Transportation needs:    Medical: Not on file    Non-medical: Not on file  Tobacco Use  . Smoking status: Former Smoker    Last attempt to quit: 11/01/2013    Years since quitting: 4.9  . Smokeless tobacco: Never Used  Substance and Sexual Activity  . Alcohol use: No  . Drug use: No  . Sexual activity: Yes  Lifestyle  . Physical activity:    Days per week: Not on file    Minutes per session: Not on file  . Stress: Not on file  Relationships  . Social connections:    Talks on phone: Not on file    Gets together: Not on file    Attends religious service: Not on file    Active member of club or organization: Not on file    Attends meetings of clubs or organizations: Not on file    Relationship status: Not on file  . Intimate partner violence:    Fear of current or ex partner: Not on file    Emotionally abused: Not on file    Physically abused: Not on file    Forced sexual activity: Not on file  Other Topics Concern  . Not on file  Social History Narrative  . Not on file    FAMILY HISTORY: Family History  Problem Relation Age of Onset    . Diabetes Mother   . Heart disease Mother   . Hypertension Mother   . Stroke Mother   . Cancer Mother        LUNG  . Mental illness Mother   . Thyroid disease Mother   . Diabetes Father   . Heart disease Father   . Diabetes Sister   . Diabetes Brother   . Heart disease Brother   . Heart disease Maternal Grandmother   . Breast cancer Neg Hx     ALLERGIES:  has No Known Allergies.  MEDICATIONS:  Current Outpatient Medications  Medication Sig Dispense Refill  . Ascorbic Acid (VITAMIN C) 100 MG tablet Take 100 mg by mouth daily.    Marland Kitchen atorvastatin (LIPITOR) 40 MG tablet TAKE 1 TABLET BY MOUTH EVERY DAY 90 tablet 1  . buPROPion (WELLBUTRIN SR) 150 MG 12 hr tablet TAKE 1 TABLET BY MOUTH EVERY DAY 90 tablet 3  . Cholecalciferol (VITAMIN D) 2000 units tablet Take 2,000 Units  by mouth daily.    . cyclobenzaprine (FLEXERIL) 10 MG tablet Take 1 tablet (10 mg total) by mouth as needed for muscle spasms. 90 tablet 1  . diclofenac sodium (VOLTAREN) 1 % GEL Apply 4 g topically 4 (four) times daily. 100 g 12  . Ferrous Sulfate Dried (SLOW RELEASE IRON) 45 MG TBCR Take 45 mg by mouth daily.    Marland Kitchen FLUoxetine (PROZAC) 20 MG tablet TAKE 1 TABLET (20 MG TOTAL) BY MOUTH AT BEDTIME.    Marland Kitchen Fluoxetine HCl, PMDD, 20 MG TABS TAKE 1 TABLET BY MOUTH EVERYDAY AT BEDTIME 90 tablet 1  . INVOKANA 300 MG TABS tablet TAKE 1 TABLET (300 MG TOTAL) BY MOUTH DAILY BEFORE BREAKFAST. 90 tablet 3  . losartan (COZAAR) 50 MG tablet Take 1 tablet (50 mg total) by mouth daily. 90 tablet 1  . metFORMIN (GLUCOPHAGE) 500 MG tablet TAKE 2 TABLETS BY MOUTH TWICE A DAY WITH A MEAL 360 tablet 3  . metFORMIN (GLUCOPHAGE) 500 MG tablet TAKE 2 TABLETS BY MOUTH TWICE A DAY WITH A MEAL 180 tablet 0  . omeprazole (PRILOSEC) 20 MG capsule TAKE 1 CAPSULE BY MOUTH EVERY DAY 90 capsule 1  . ondansetron (ZOFRAN) 4 MG tablet Take 1 tablet (4 mg total) by mouth every 8 (eight) hours as needed for nausea or vomiting. 20 tablet 0  . Semaglutide  (OZEMPIC) 0.25 or 0.5 MG/DOSE SOPN Inject 0.5 mg into the skin once a week. 4 pen 5  . vitamin B-12 (CYANOCOBALAMIN) 100 MCG tablet Take 100 mcg by mouth daily.    . fluticasone (FLONASE) 50 MCG/ACT nasal spray Place 2 sprays into both nostrils daily.     No current facility-administered medications for this visit.      PHYSICAL EXAMINATION: ECOG PERFORMANCE STATUS: 0 - Asymptomatic Vitals:   10/28/18 1340  BP: 128/86  Pulse: 72  Resp: 18  Temp: (!) 97.5 F (36.4 C)   Filed Weights   10/28/18 1340  Weight: 280 lb 12.8 oz (127.4 kg)    Physical Exam  Constitutional: She is oriented to person, place, and time and well-developed, well-nourished, and in no distress. No distress.  Morbidly obese  HENT:  Head: Normocephalic and atraumatic.  Nose: Nose normal.  Mouth/Throat: Oropharynx is clear and moist. No oropharyngeal exudate.  Eyes: Pupils are equal, round, and reactive to light. EOM are normal. No scleral icterus.  Neck: Normal range of motion. Neck supple.  Cardiovascular: Normal rate, regular rhythm and normal heart sounds.  No murmur heard. Pulmonary/Chest: Effort normal and breath sounds normal. No respiratory distress. She has no wheezes.  Abdominal: Soft. Bowel sounds are normal. She exhibits no distension. There is no tenderness.  Musculoskeletal: Normal range of motion. She exhibits no edema or tenderness.  Neurological: She is alert and oriented to person, place, and time. No cranial nerve deficit.  Skin: Skin is warm and dry. No rash noted. She is not diaphoretic. No erythema.  Psychiatric: Affect and judgment normal.     LABORATORY DATA:  I have reviewed the data as listed Lab Results  Component Value Date   WBC 10.7 08/25/2018   HGB 14.2 08/25/2018   HCT 43.8 08/25/2018   MCV 81 08/25/2018   PLT 384 08/25/2018   Recent Labs    05/03/18 0858 08/01/18 0847 08/25/18 1037  NA 140 138 142  K 4.7 5.2 4.4  CL 102 98 99  CO2 20 23 24   GLUCOSE 168* 127*  136*  BUN 16 15 11  CREATININE 0.57 0.75 0.79  CALCIUM 9.4 9.8 9.6  GFRNONAA 108 93 88  GFRAA 125 107 101  PROT 6.2 6.5 6.6  ALBUMIN 4.3 4.3 4.5  AST 21 15 16   ALT 33* 22 27  ALKPHOS 88 82 81  BILITOT 0.3 0.2 0.2    Pathology A: Small bowel, duodenum, biopsy - Neuroendocrine tumor grade 1 (G1; well differentiated neuroendocrine tumor, carcinoid)  - Tumor size at least 2.5 mm in greatest dimension - Neuroendocrine tumor extends to edges of specimen   ASSESSMENT & PLAN:  1. Neuroendocrine carcinoma ([place])   2. 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 Expect to fall off.   Previous DOTADATE scan negative for metastatic disease. Baseline 5-HIAA level normal, mild elevated chromogranin A.  Will repeat labs in 6 months.   Follow up with GI for surveillance.   # Iron deficiency anemia, hemoglobin stable.  Continue oral iron supplementation.  All questions were answered. The patient knows to call the clinic with any problems questions or concerns.  Return of visit: 6 months.  Total face to face encounter time for this patient visit was 15 min. >50% of the time was  spent in counseling and coordination of care.   Earlie Server, MD, PhD Hematology Oncology [facility] at [facility] Pager- [phone] 10/28/2018

## 2018-11-25 ENCOUNTER — Other Ambulatory Visit: Payer: Self-pay | Admitting: Family Medicine

## 2018-11-25 ENCOUNTER — Other Ambulatory Visit: Payer: Self-pay | Admitting: Unknown Physician Specialty

## 2018-12-02 ENCOUNTER — Encounter: Payer: Self-pay | Admitting: Family Medicine

## 2018-12-02 ENCOUNTER — Ambulatory Visit (INDEPENDENT_AMBULATORY_CARE_PROVIDER_SITE_OTHER): Payer: Commercial Managed Care - PPO | Admitting: Family Medicine

## 2018-12-02 VITALS — BP 136/81 | HR 72 | Temp 98.1°F | Ht 62.0 in | Wt 280.6 lb

## 2018-12-02 DIAGNOSIS — M25511 Pain in right shoulder: Secondary | ICD-10-CM

## 2018-12-02 DIAGNOSIS — E1165 Type 2 diabetes mellitus with hyperglycemia: Secondary | ICD-10-CM

## 2018-12-02 DIAGNOSIS — G8929 Other chronic pain: Secondary | ICD-10-CM

## 2018-12-02 NOTE — Progress Notes (Signed)
BP 136/81   Pulse 72   Temp 98.1 F (36.7 C) (Oral)   Ht 5\' 2"  (1.575 m)   Wt 280 lb 9.6 oz (127.3 kg)   LMP 10/27/2018 (Approximate)   BMI 51.32 kg/m    Subjective:    Patient ID: Eileen Ritter, female    DOB: March 22, 1966, 52 y.o.   MRN: [id number]  HPI: Eileen Ritter is a 52 y.o. female  Chief Complaint  Patient presents with  . Diabetes    3 month f/up    Here today for DM follow up. Taking her medications faithfully without side effects. Taking 0.5 ozempic and tolerating it much better. Still taking metformin and invokana additionally. Does not check BS at home. No low blood sugar spells. Feels like her diet and exercise needs improvement.   Anterior shoulder pain that keeps her awake some nights and aches all day long worsening x 3 months. Difficulty extending laterally, but can go overhead. No known injury, redness, swelling, stiffness. Using diclofenac gel with minimal benefit. Known OA of knees, thinks this is arthritic as well.   Past Medical History:  Diagnosis Date  . Allergic rhinitis   . Anxiety   . Chronic kidney disease   . Depression   . Diabetes ([place])   . GERD (gastroesophageal reflux disease)   . HBP (high blood pressure)   . High cholesterol   . Iron deficiency anemia due to chronic blood loss 01/13/2018  . Obesity    Social History   Socioeconomic History  . Marital status: Single    Spouse name: Not on file  . Number of children: Not on file  . Years of education: Not on file  . Highest education level: Not on file  Occupational History  . Not on file  Social Needs  . Financial resource strain: Not on file  . Food insecurity:    Worry: Not on file    Inability: Not on file  . Transportation needs:    Medical: Not on file    Non-medical: Not on file  Tobacco Use  . Smoking status: Former Smoker    Last attempt to quit: 11/01/2013    Years since quitting: 5.0  . Smokeless tobacco: Never Used  Substance and Sexual  Activity  . Alcohol use: No  . Drug use: No  . Sexual activity: Yes  Lifestyle  . Physical activity:    Days per week: Not on file    Minutes per session: Not on file  . Stress: Not on file  Relationships  . Social connections:    Talks on phone: Not on file    Gets together: Not on file    Attends religious service: Not on file    Active member of club or organization: Not on file    Attends meetings of clubs or organizations: Not on file    Relationship status: Not on file  . Intimate partner violence:    Fear of current or ex partner: Not on file    Emotionally abused: Not on file    Physically abused: Not on file    Forced sexual activity: Not on file  Other Topics Concern  . Not on file  Social History Narrative  . Not on file    Relevant past medical, surgical, family and social history reviewed and updated as indicated. Interim medical history since our last visit reviewed. Allergies and medications reviewed and updated.  Review of Systems  Per HPI unless specifically indicated  above     Objective:    BP 136/81   Pulse 72   Temp 98.1 F (36.7 C) (Oral)   Ht 5\' 2"  (1.575 m)   Wt 280 lb 9.6 oz (127.3 kg)   LMP 10/27/2018 (Approximate)   BMI 51.32 kg/m   Wt Readings from Last 3 Encounters:  12/02/18 280 lb 9.6 oz (127.3 kg)  10/28/18 280 lb 12.8 oz (127.4 kg)  08/25/18 278 lb 8 oz (126.3 kg)    Physical Exam Vitals signs and nursing note reviewed.  Constitutional:      Appearance: Normal appearance. She is obese. She is not ill-appearing.  HENT:     Head: Atraumatic.  Eyes:     Extraocular Movements: Extraocular movements intact.     Conjunctiva/sclera: Conjunctivae normal.  Neck:     Musculoskeletal: Normal range of motion and neck supple.  Cardiovascular:     Rate and Rhythm: Normal rate and regular rhythm.     Heart sounds: Normal heart sounds.  Pulmonary:     Effort: Pulmonary effort is normal.     Breath sounds: Normal breath sounds.    Musculoskeletal:        General: Tenderness (anterior right shoulder ttp into deltoid) present. No swelling or signs of injury.     Comments: Pain with lateral extension and reach behind back  Skin:    General: Skin is warm and dry.  Neurological:     Mental Status: She is alert and oriented to person, place, and time.  Psychiatric:        Mood and Affect: Mood normal.        Thought Content: Thought content normal.        Judgment: Judgment normal.     Results for orders placed or performed in visit on 08/25/18  Microscopic Examination  Result Value Ref Range   WBC, UA 0-5 0 - 5 /hpf   RBC, UA 0-2 0 - 2 /hpf   Epithelial Cells (non renal) 0-10 0 - 10 /hpf   Renal Epithel, UA 0-10 (A) None seen /hpf   Bacteria, UA Few None seen/Few   Yeast, UA Present None seen  CBC with Differential/Platelet  Result Value Ref Range   WBC 10.7 3.4 - 10.8 x10E3/uL   RBC 5.44 (H) 3.77 - 5.28 x10E6/uL   Hemoglobin 14.2 11.1 - 15.9 g/dL   Hematocrit 43.8 34.0 - 46.6 %   MCV 81 79 - 97 fL   MCH 26.1 (L) 26.6 - 33.0 pg   MCHC 32.4 31.5 - 35.7 g/dL   RDW 13.8 12.3 - 15.4 %   Platelets 384 150 - 450 x10E3/uL   Neutrophils 55 Not Estab. %   Lymphs 34 Not Estab. %   Monocytes 8 Not Estab. %   Eos 2 Not Estab. %   Basos 1 Not Estab. %   Neutrophils Absolute 5.9 1.4 - 7.0 x10E3/uL   Lymphocytes Absolute 3.7 (H) 0.7 - 3.1 x10E3/uL   Monocytes Absolute 0.8 0.1 - 0.9 x10E3/uL   EOS (ABSOLUTE) 0.2 0.0 - 0.4 x10E3/uL   Basophils Absolute 0.1 0.0 - 0.2 x10E3/uL   Immature Granulocytes 0 Not Estab. %   Immature Grans (Abs) 0.0 0.0 - 0.1 x10E3/uL  Comprehensive metabolic panel  Result Value Ref Range   Glucose 136 (H) 65 - 99 mg/dL   BUN 11 6 - 24 mg/dL   Creatinine, Ser 0.79 0.57 - 1.00 mg/dL   GFR calc non Af Amer 88 >59 mL/min/1.73  GFR calc Af Amer 101 >59 mL/min/1.73   BUN/Creatinine Ratio 14 9 - 23   Sodium 142 134 - 144 mmol/L   Potassium 4.4 3.5 - 5.2 mmol/L   Chloride 99 96 - 106  mmol/L   CO2 24 20 - 29 mmol/L   Calcium 9.6 8.7 - 10.2 mg/dL   Total Protein 6.6 6.0 - 8.5 g/dL   Albumin 4.5 3.5 - 5.5 g/dL   Globulin, Total 2.1 1.5 - 4.5 g/dL   Albumin/Globulin Ratio 2.1 1.2 - 2.2   Bilirubin Total 0.2 0.0 - 1.2 mg/dL   Alkaline Phosphatase 81 39 - 117 IU/L   AST 16 0 - 40 IU/L   ALT 27 0 - 32 IU/L  Lipid Panel w/o Chol/HDL Ratio  Result Value Ref Range   Cholesterol, Total 144 100 - 199 mg/dL   Triglycerides 129 0 - 149 mg/dL   HDL 43 >39 mg/dL   VLDL Cholesterol Cal 26 5 - 40 mg/dL   LDL Calculated 75 0 - 99 mg/dL  TSH  Result Value Ref Range   TSH 3.870 0.450 - 4.500 uIU/mL  UA/M w/rflx Culture, Routine  Result Value Ref Range   Specific Gravity, UA 1.010 1.005 - 1.030   pH, UA 6.0 5.0 - 7.5   Color, UA Yellow Yellow   Appearance Ur Hazy (A) Clear   Leukocytes, UA Negative Negative   Protein, UA Negative Negative/Trace   Glucose, UA 3+ (A) Negative   Ketones, UA Negative Negative   RBC, UA Trace (A) Negative   Bilirubin, UA Negative Negative   Urobilinogen, Ur 0.2 0.2 - 1.0 mg/dL   Nitrite, UA Negative Negative   Microscopic Examination See below:   HgB A1c  Result Value Ref Range   Hgb A1c MFr Bld 7.4 (H) 4.8 - 5.6 %   Est. average glucose Bld gHb Est-mCnc 166 mg/dL      Assessment & Plan:   Problem List Items Addressed This Visit      Endocrine   Uncontrolled type 2 diabetes mellitus ([place]) - Primary    Recheck A1C, adjust as needed. Discussed at length about diet and exercise to better control sugars. Continue current regimen      Relevant Orders   HgB A1c     Other   Morbid obesity ([place])    Long discussion about diet and exercise modifications. Continue with reduced portions, ozempic and wellbutrin to help with appetite       Other Visit Diagnoses    Chronic right shoulder pain       Will get x-ray for further eval. Continue diclofenac gel, start stretches, postural modification, and small muscle strengthening exercises    Relevant Orders   DG Shoulder Right       Follow up plan: Return in about 3 months (around 03/03/2019) for 6 month f/u.

## 2018-12-02 NOTE — Assessment & Plan Note (Signed)
Long discussion about diet and exercise modifications. Continue with reduced portions, ozempic and wellbutrin to help with appetite

## 2018-12-02 NOTE — Assessment & Plan Note (Signed)
Recheck A1C, adjust as needed. Discussed at length about diet and exercise to better control sugars. Continue current regimen

## 2018-12-03 LAB — HEMOGLOBIN A1C
Est. average glucose Bld gHb Est-mCnc: 154 mg/dL
Hgb A1c MFr Bld: 7 % — ABNORMAL HIGH (ref 4.8–5.6)

## 2018-12-05 ENCOUNTER — Ambulatory Visit
Admission: RE | Admit: 2018-12-05 | Discharge: 2018-12-05 | Disposition: A | Discharge status: Home / Self Care | Payer: Commercial Managed Care - PPO | Attending: Family Medicine | Admitting: Family Medicine

## 2018-12-05 ENCOUNTER — Ambulatory Visit
Admission: RE | Admit: 2018-12-05 | Discharge: 2018-12-05 | Disposition: A | Discharge status: Home / Self Care | Payer: Commercial Managed Care - PPO | Source: Ambulatory Visit | Attending: Family Medicine | Admitting: Family Medicine

## 2018-12-05 DIAGNOSIS — G8929 Other chronic pain: Secondary | ICD-10-CM | POA: Diagnosis present

## 2018-12-05 DIAGNOSIS — M25511 Pain in right shoulder: Secondary | ICD-10-CM | POA: Diagnosis not present

## 2018-12-22 LAB — HM DIABETES EYE EXAM

## 2018-12-28 ENCOUNTER — Encounter: Payer: Self-pay | Admitting: Family Medicine

## 2018-12-29 ENCOUNTER — Encounter: Payer: Self-pay | Admitting: Family Medicine

## 2018-12-30 ENCOUNTER — Ambulatory Visit (INDEPENDENT_AMBULATORY_CARE_PROVIDER_SITE_OTHER): Payer: Commercial Managed Care - PPO | Admitting: Family Medicine

## 2018-12-30 ENCOUNTER — Encounter: Payer: Self-pay | Admitting: Family Medicine

## 2018-12-30 VITALS — BP 108/73 | HR 74 | Temp 97.8°F | Wt 276.0 lb

## 2018-12-30 DIAGNOSIS — B373 Candidiasis of vulva and vagina: Secondary | ICD-10-CM

## 2018-12-30 DIAGNOSIS — B3731 Acute candidiasis of vulva and vagina: Secondary | ICD-10-CM

## 2018-12-30 DIAGNOSIS — E1165 Type 2 diabetes mellitus with hyperglycemia: Secondary | ICD-10-CM | POA: Diagnosis not present

## 2018-12-30 LAB — WET PREP FOR TRICH, YEAST, CLUE
Clue Cell Exam: NEGATIVE
Trichomonas Exam: NEGATIVE
Yeast Exam: POSITIVE — AB

## 2018-12-30 MED ORDER — SITAGLIPTIN PHOSPHATE 25 MG PO TABS: 25.0000 mg | ORAL_TABLET | Freq: Every day | ORAL | 3 refills | Status: DC

## 2018-12-30 MED ORDER — FLUCONAZOLE 150 MG PO TABS: 150.0000 mg | ORAL_TABLET | Freq: Once | ORAL | 1 refills | Status: AC

## 2018-12-30 MED ORDER — SEMAGLUTIDE(0.25 OR 0.5MG/DOS) 2 MG/1.5ML ~~LOC~~ SOPN: 0.5000 mg | PEN_INJECTOR | SUBCUTANEOUS | 5 refills | Status: DC

## 2018-12-30 MED ORDER — GLUCOSE BLOOD VI STRP: ORAL_STRIP | 12 refills | Status: AC

## 2018-12-30 NOTE — Assessment & Plan Note (Signed)
Will add januvia while off ozempic and re-send ozempic to see if we can get it covered again. Start home checks again, strips sent. Diet and exercise reviewed

## 2018-12-30 NOTE — Progress Notes (Signed)
BP 108/73   Pulse 74   Temp 97.8 F (36.6 C) (Oral)   Wt 276 lb (125.2 kg)   SpO2 98%   BMI 50.48 kg/m    Subjective:    Patient ID: Eileen Ritter, female    DOB: [birth year], 53 y.o.   MRN: [id number]  HPI: Eileen Ritter is a 53 y.o. female  Chief Complaint  Patient presents with  . Vaginal Discharge    for 2 days    2 days of itching and vaginal irritation. No dysuria, frequency, abdominal pain, fevers, odor. Not trying anything OTC for sxs. Gets yeast infections frequently.   Has been having issues getting her ozempic due to cost all of a sudden. Not taking it x 1 week now. A1C at recent visit elevated at 7.4 when on the medicine. No low blood sugar spells, unsure about hyperglycemia as she's been out of test strips.   Relevant past medical, surgical, family and social history reviewed and updated as indicated. Interim medical history since our last visit reviewed. Allergies and medications reviewed and updated.  Review of Systems  Per HPI unless specifically indicated above     Objective:    BP 108/73   Pulse 74   Temp 97.8 F (36.6 C) (Oral)   Wt 276 lb (125.2 kg)   SpO2 98%   BMI 50.48 kg/m   Wt Readings from Last 3 Encounters:  12/30/18 276 lb (125.2 kg)  12/02/18 280 lb 9.6 oz (127.3 kg)  10/28/18 280 lb 12.8 oz (127.4 kg)    Physical Exam Vitals signs and nursing note reviewed.  Constitutional:      Appearance: Normal appearance. She is not ill-appearing.  HENT:     Head: Atraumatic.  Eyes:     Extraocular Movements: Extraocular movements intact.     Conjunctiva/sclera: Conjunctivae normal.  Neck:     Musculoskeletal: Normal range of motion and neck supple.  Cardiovascular:     Rate and Rhythm: Normal rate and regular rhythm.     Heart sounds: Normal heart sounds.  Pulmonary:     Effort: Pulmonary effort is normal.     Breath sounds: Normal breath sounds.  Abdominal:     General: Bowel sounds are normal.     Palpations:  Abdomen is soft.     Tenderness: There is no abdominal tenderness. There is no right CVA tenderness, left CVA tenderness or guarding.  Genitourinary:    Vagina: Vaginal discharge present.  Musculoskeletal: Normal range of motion.  Skin:    General: Skin is warm and dry.  Neurological:     Mental Status: She is alert and oriented to person, place, and time.  Psychiatric:        Mood and Affect: Mood normal.        Thought Content: Thought content normal.        Judgment: Judgment normal.     Results for orders placed or performed in visit on 12/02/18  HgB A1c  Result Value Ref Range   Hgb A1c MFr Bld 7.0 (H) 4.8 - 5.6 %   Est. average glucose Bld gHb Est-mCnc 154 mg/dL      Assessment & Plan:   Problem List Items Addressed This Visit      Endocrine   Uncontrolled type 2 diabetes mellitus ([place])    Will add januvia while off ozempic and re-send ozempic to see if we can get it covered again. Start home checks again, strips sent. Diet and exercise reviewed  Relevant Medications   Semaglutide,0.25 or 0.5MG /DOS, (OZEMPIC, 0.25 OR 0.5 MG/DOSE,) 2 MG/1.5ML SOPN   sitaGLIPtin (JANUVIA) 25 MG tablet    Other Visit Diagnoses    Vaginal candidiasis    -  Primary   Will send diflucan with extra tablets as she's traveling soon and worried it will come back while on vacation. Good sugar control discussed   Relevant Medications   fluconazole (DIFLUCAN) 150 MG tablet   Other Relevant Orders   WET PREP FOR TRICH, YEAST, CLUE      Greater than 25 min spent in direct counseling and education today with patient  Follow up plan: Return for as scheduled.

## 2019-02-01 ENCOUNTER — Other Ambulatory Visit: Payer: Self-pay | Admitting: Oncology

## 2019-02-01 DIAGNOSIS — C7A8 Other malignant neuroendocrine tumors: Secondary | ICD-10-CM

## 2019-02-10 ENCOUNTER — Other Ambulatory Visit: Payer: Self-pay | Admitting: Unknown Physician Specialty

## 2019-02-24 ENCOUNTER — Other Ambulatory Visit: Payer: Self-pay | Admitting: Family Medicine

## 2019-02-24 NOTE — Telephone Encounter (Signed)
Requested Prescriptions  Pending Prescriptions Disp Refills  . FLUoxetine (PROZAC) 20 MG tablet [Pharmacy Med Name: FLUOXETINE HCL 20 MG TABLET] 90 tablet 0    Sig: TAKE 1 TABLET BY MOUTH EVERYDAY AT BEDTIME     Psychiatry:  Antidepressants - SSRI Passed - 02/24/2019  2:35 PM      Passed - Completed PHQ-2 or PHQ-9 in the last 360 days.      Passed - Valid encounter within last 6 months    Recent Outpatient Visits          1 month ago Vaginal candidiasis   [facility] Merrie Roof [place], [place]   2 months ago Uncontrolled type 2 diabetes mellitus with hyperglycemia [facility])   [facility] [place], [place]   6 months ago Essential hypertension   [place], [place], [place]   6 months ago Uncontrolled type 2 diabetes mellitus with hyperglycemia [facility])   [place], [place], [place]   8 months ago Uncontrolled type 2 diabetes mellitus with hyperglycemia [facility])   [facility] Kathrine Haddock, NP      Future Appointments            In 1 week Orene Desanctis, Lilia Argue, [place], [facility]

## 2019-02-27 ENCOUNTER — Other Ambulatory Visit: Payer: Self-pay | Admitting: Family Medicine

## 2019-02-28 ENCOUNTER — Encounter: Payer: Self-pay | Admitting: Family Medicine

## 2019-03-03 ENCOUNTER — Encounter: Payer: Self-pay | Admitting: Family Medicine

## 2019-03-03 ENCOUNTER — Other Ambulatory Visit: Payer: Self-pay

## 2019-03-03 ENCOUNTER — Ambulatory Visit: Payer: Commercial Managed Care - PPO | Admitting: Family Medicine

## 2019-03-03 VITALS — BP 140/85 | HR 80 | Temp 98.2°F

## 2019-03-03 DIAGNOSIS — N951 Menopausal and female climacteric states: Secondary | ICD-10-CM

## 2019-03-03 DIAGNOSIS — E782 Mixed hyperlipidemia: Secondary | ICD-10-CM

## 2019-03-03 DIAGNOSIS — I10 Essential (primary) hypertension: Secondary | ICD-10-CM | POA: Diagnosis not present

## 2019-03-03 DIAGNOSIS — F3341 Major depressive disorder, recurrent, in partial remission: Secondary | ICD-10-CM

## 2019-03-03 DIAGNOSIS — E1165 Type 2 diabetes mellitus with hyperglycemia: Secondary | ICD-10-CM | POA: Diagnosis not present

## 2019-03-03 DIAGNOSIS — J3089 Other allergic rhinitis: Secondary | ICD-10-CM

## 2019-03-03 MED ORDER — AMOXICILLIN-POT CLAVULANATE 875-125 MG PO TABS: 1.0000 | ORAL_TABLET | Freq: Two times a day (BID) | ORAL | 0 refills | Status: DC

## 2019-03-03 MED ORDER — CLONIDINE HCL 0.1 MG PO TABS: 0.1000 mg | ORAL_TABLET | Freq: Every day | ORAL | 1 refills | Status: DC

## 2019-03-03 MED ORDER — DULAGLUTIDE 0.75 MG/0.5ML ~~LOC~~ SOAJ: 0.7500 mg | SUBCUTANEOUS | 2 refills | Status: DC

## 2019-03-03 MED ORDER — MONTELUKAST SODIUM 10 MG PO TABS: 10.0000 mg | ORAL_TABLET | Freq: Every day | ORAL | 3 refills | Status: DC

## 2019-03-03 NOTE — Assessment & Plan Note (Signed)
Suspect recurrent congestion and sinus issues from poorly controlled allergies. Will add singulair given tolerance issues with antihistamines and increase flonase to BID dosing. Sinus rinses, humidifier prn. Will send abx in case sxs worsening but currently no evidence of bacterial infection. Monitor closely for benefit, return precautions given

## 2019-03-03 NOTE — Assessment & Plan Note (Signed)
Will send trulicity to see if better covered. Continue metformin and invokana. Work hard on lifestyle modifications for improved control and recheck in 3 months

## 2019-03-03 NOTE — Assessment & Plan Note (Signed)
Recheck lipids, adjust as needed. Work hard on diet and exercise improvements

## 2019-03-03 NOTE — Assessment & Plan Note (Signed)
Moods under good control with prozac and wellbutrin regimen. Increase exercise, work on stress reduction techniques

## 2019-03-03 NOTE — Assessment & Plan Note (Signed)
BPs stable and WNL, continue current regimen 

## 2019-03-03 NOTE — Progress Notes (Signed)
BP 140/85   Pulse 80   Temp 98.2 F (36.8 C) (Oral)   SpO2 96%    Subjective:    Patient ID: Eileen Ritter, female    DOB: [birth year], 53 y.o.   MRN: [id number]  HPI: [name] is a 53 y.o. female  Chief Complaint  Patient presents with  . Depression  . Diabetes  . Hypertension  . URI    pt states she was diagnosed with sinusitis by [facility] and was supposed to get an Z-pack but has not yet   Here today for 6 month f/u and some sick sxs she's been having.   Started 6 weeks ago with sinus sxs and cough. Got abx at that time and did better for about 2 weeks. Sxs then returned, started back on flonase and now the past week has had a worsened sore throat, headache, facial pain and pressure. Did a telehealth visit 3 days ago and dx'd with sinusitis and bronchitis and was supposed to get a zpak but it never got to the pharmacy. Tried zyrtec initially but this dried her out too much so she stopped. Denies fevers, chills, body aches, CP, SOB, wheezing. No recent travel or known positive contacts for COVID 19.   Does not check BP at home, but takes her medicine faithfully without side effects. Denies CP, SOB, dizziness.   Taking invokana and metformin and was on ozempic but had coverage issues so has been off for about 3 months. Home BSs around 150s right now. Diet has been very off track, stress eating quite often especially with what has been going on with virus and her dating life. Denies low blood sugar spells.   LMP Nov 14th 2019. Having terrible hot flashes, especially at night.   Anxiety under better control with prozac and wellbutrin, no panic episodes but does feel anxious. Denies SI/HI.   Depression screen [facility] 2/9 03/03/2019 08/25/2018 08/01/2018  Decreased Interest 0 0 0  Down, Depressed, Hopeless 2 0 1  PHQ - 2 Score 2 0 1  Altered sleeping 1 1 0  Tired, decreased energy 1 1 0  Change in appetite 1 0 0  Feeling bad or failure about yourself  1 0 0   Trouble concentrating 0 0 0  Moving slowly or fidgety/restless 0 0 0  Suicidal thoughts 0 0 0  PHQ-9 Score 6 2 1    GAD 7 : Generalized Anxiety Score 08/25/2018  Nervous, Anxious, on Edge 1  Control/stop worrying 1  Worry too much - different things 1  Trouble relaxing 1  Restless 0  Easily annoyed or irritable 1  Afraid - awful might happen 0  Total GAD 7 Score 5     Relevant past medical, surgical, family and social history reviewed and updated as indicated. Interim medical history since our last visit reviewed. Allergies and medications reviewed and updated.  Review of Systems  Per HPI unless specifically indicated above     Objective:    BP 140/85   Pulse 80   Temp 98.2 F (36.8 C) (Oral)   SpO2 96%   Wt Readings from Last 3 Encounters:  12/30/18 276 lb (125.2 kg)  12/02/18 280 lb 9.6 oz (127.3 kg)  10/28/18 280 lb 12.8 oz (127.4 kg)    Physical Exam Vitals signs and nursing note reviewed.  Constitutional:      Appearance: Normal appearance. She is not ill-appearing.  HENT:     Head: Atraumatic.     Nose:  Comments: Nasal mucosa erythematous and edematous    Mouth/Throat:     Pharynx: Posterior oropharyngeal erythema present. No oropharyngeal exudate.  Eyes:     Extraocular Movements: Extraocular movements intact.     Conjunctiva/sclera: Conjunctivae normal.  Neck:     Musculoskeletal: Normal range of motion and neck supple.  Cardiovascular:     Rate and Rhythm: Normal rate and regular rhythm.     Heart sounds: Normal heart sounds.  Pulmonary:     Effort: Pulmonary effort is normal. No respiratory distress.     Breath sounds: Normal breath sounds. No wheezing or rales.  Musculoskeletal: Normal range of motion.  Skin:    General: Skin is warm and dry.  Neurological:     Mental Status: She is alert and oriented to person, place, and time.  Psychiatric:        Mood and Affect: Mood normal.        Thought Content: Thought content normal.         Judgment: Judgment normal.    Results for orders placed or performed in visit on 12/30/18  WET PREP FOR [place], YEAST, CLUE  Result Value Ref Range   Trichomonas Exam Negative Negative   Yeast Exam Positive (A) Negative   Clue Cell Exam Negative Negative      Assessment & Plan:   Problem List Items Addressed This Visit      Cardiovascular and Mediastinum   HTN (hypertension)    BPs stable and WNL, continue current regimen      Relevant Medications   cloNIDine (CATAPRES) 0.1 MG tablet   Other Relevant Orders   Comprehensive metabolic panel     Respiratory   Allergic rhinitis    Suspect recurrent congestion and sinus issues from poorly controlled allergies. Will add singulair given tolerance issues with antihistamines and increase flonase to BID dosing. Sinus rinses, humidifier prn. Will send abx in case sxs worsening but currently no evidence of bacterial infection. Monitor closely for benefit, return precautions given        Endocrine   Uncontrolled type 2 diabetes mellitus ([place]) - Primary    Will send trulicity to see if better covered. Continue metformin and invokana. Work hard on lifestyle modifications for improved control and recheck in 3 months      Relevant Medications   Dulaglutide (TRULICITY) 2.58 NI/7.7OE SOPN   Other Relevant Orders   HgB A1c     Other   Hyperlipidemia    Recheck lipids, adjust as needed. Work hard on diet and exercise improvements      Relevant Medications   cloNIDine (CATAPRES) 0.1 MG tablet   Other Relevant Orders   Lipid Panel w/o Chol/HDL Ratio   Depression    Moods under good control with prozac and wellbutrin regimen. Increase exercise, work on stress reduction techniques       Other Visit Diagnoses    Hot flashes due to menopause       Worst at bedtime. Start clonidine QHS prn and monitor for benefit. Watch BPs closely, hypotension signs and sxs reviewed       Follow up plan: Return in about 3 months (around 06/03/2019)  for DM.

## 2019-03-04 LAB — COMPREHENSIVE METABOLIC PANEL
ALT: 37 IU/L — ABNORMAL HIGH (ref 0–32)
AST: 18 IU/L (ref 0–40)
Albumin/Globulin Ratio: 1.9 (ref 1.2–2.2)
Albumin: 4.4 g/dL (ref 3.8–4.8)
Alkaline Phosphatase: 97 IU/L (ref 39–117)
BUN/Creatinine Ratio: 19 (ref 9–23)
BUN: 13 mg/dL (ref 6–24)
Bilirubin Total: <0.2 mg/dL (ref 0.0–1.2)
CHLORIDE: 96 mmol/L (ref 96–106)
CO2: 23 mmol/L (ref 20–29)
Calcium: 9.7 mg/dL (ref 8.7–10.2)
Creatinine, Ser: 0.69 mg/dL (ref 0.57–1.00)
GFR calc Af Amer: 117 mL/min/{1.73_m2} (ref >59)
GFR calc non Af Amer: 102 mL/min/{1.73_m2} (ref >59)
GLOBULIN, TOTAL: 2.3 g/dL (ref 1.5–4.5)
Glucose: 170 mg/dL — ABNORMAL HIGH (ref 65–99)
Potassium: 4.7 mmol/L (ref 3.5–5.2)
SODIUM: 139 mmol/L (ref 134–144)
Total Protein: 6.7 g/dL (ref 6.0–8.5)

## 2019-03-04 LAB — LIPID PANEL W/O CHOL/HDL RATIO
Cholesterol, Total: 165 mg/dL (ref 100–199)
HDL: 44 mg/dL (ref >39)
LDL Calculated: 88 mg/dL (ref 0–99)
Triglycerides: 165 mg/dL — ABNORMAL HIGH (ref 0–149)
VLDL Cholesterol Cal: 33 mg/dL (ref 5–40)

## 2019-03-04 LAB — HEMOGLOBIN A1C
Est. average glucose Bld gHb Est-mCnc: 177 mg/dL
Hgb A1c MFr Bld: 7.8 % — ABNORMAL HIGH (ref 4.8–5.6)

## 2019-03-06 ENCOUNTER — Encounter: Payer: Self-pay | Admitting: Family Medicine

## 2019-03-06 ENCOUNTER — Ambulatory Visit: Payer: Commercial Managed Care - PPO | Admitting: Family Medicine

## 2019-03-08 ENCOUNTER — Other Ambulatory Visit: Payer: Self-pay | Admitting: Family Medicine

## 2019-03-09 ENCOUNTER — Other Ambulatory Visit: Payer: Self-pay | Admitting: Family Medicine

## 2019-03-15 ENCOUNTER — Encounter: Payer: Self-pay | Admitting: Family Medicine

## 2019-03-15 ENCOUNTER — Other Ambulatory Visit: Payer: Self-pay | Admitting: Family Medicine

## 2019-03-15 MED ORDER — FLUCONAZOLE 150 MG PO TABS: 150.0000 mg | ORAL_TABLET | Freq: Once | ORAL | 0 refills | Status: AC

## 2019-03-26 ENCOUNTER — Other Ambulatory Visit: Payer: Self-pay | Admitting: Family Medicine

## 2019-04-06 ENCOUNTER — Encounter: Payer: Self-pay | Admitting: Family Medicine

## 2019-04-27 ENCOUNTER — Telehealth: Payer: Self-pay | Admitting: Oncology

## 2019-04-28 ENCOUNTER — Encounter: Payer: Self-pay | Admitting: Oncology

## 2019-04-28 ENCOUNTER — Other Ambulatory Visit: Payer: Self-pay

## 2019-04-28 ENCOUNTER — Inpatient Hospital Stay: Payer: Commercial Managed Care - PPO | Attending: Oncology | Admitting: Oncology

## 2019-04-28 ENCOUNTER — Inpatient Hospital Stay: Payer: Commercial Managed Care - PPO

## 2019-04-28 VITALS — BP 145/85 | HR 79 | Temp 98.0°F | Resp 20

## 2019-04-28 DIAGNOSIS — D508 Other iron deficiency anemias: Secondary | ICD-10-CM | POA: Insufficient documentation

## 2019-04-28 DIAGNOSIS — D500 Iron deficiency anemia secondary to blood loss (chronic): Secondary | ICD-10-CM | POA: Diagnosis not present

## 2019-04-28 DIAGNOSIS — C7A8 Other malignant neuroendocrine tumors: Secondary | ICD-10-CM

## 2019-04-28 DIAGNOSIS — D509 Iron deficiency anemia, unspecified: Secondary | ICD-10-CM

## 2019-04-28 DIAGNOSIS — Z79899 Other long term (current) drug therapy: Secondary | ICD-10-CM

## 2019-04-28 DIAGNOSIS — D72820 Lymphocytosis (symptomatic): Secondary | ICD-10-CM | POA: Insufficient documentation

## 2019-04-28 LAB — IRON AND TIBC
Iron: 67 ug/dL (ref 28–170)
Saturation Ratios: 18 % (ref 10.4–31.8)
TIBC: 383 ug/dL (ref 250–450)
UIBC: 316 ug/dL

## 2019-04-28 LAB — CBC WITH DIFFERENTIAL/PLATELET
Abs Immature Granulocytes: 0.03 10*3/uL (ref 0.00–0.07)
Basophils Absolute: 0.1 10*3/uL (ref 0.0–0.1)
Basophils Relative: 1 %
Eosinophils Absolute: 0.4 10*3/uL (ref 0.0–0.5)
Eosinophils Relative: 3 %
HCT: 44.7 % (ref 36.0–46.0)
Hemoglobin: 14.5 g/dL (ref 12.0–15.0)
Immature Granulocytes: 0 %
Lymphocytes Relative: 36 %
Lymphs Abs: 4.4 10*3/uL — ABNORMAL HIGH (ref 0.7–4.0)
MCH: 26.7 pg (ref 26.0–34.0)
MCHC: 32.4 g/dL (ref 30.0–36.0)
MCV: 82.3 fL (ref 80.0–100.0)
Monocytes Absolute: 1 10*3/uL (ref 0.1–1.0)
Monocytes Relative: 9 %
Neutro Abs: 6.2 10*3/uL (ref 1.7–7.7)
Neutrophils Relative %: 51 %
Platelets: 353 10*3/uL (ref 150–400)
RBC: 5.43 MIL/uL — ABNORMAL HIGH (ref 3.87–5.11)
RDW: 13.6 % (ref 11.5–15.5)
WBC: 12.1 10*3/uL — ABNORMAL HIGH (ref 4.0–10.5)
nRBC: 0 % (ref 0.0–0.2)

## 2019-04-28 LAB — FERRITIN: Ferritin: 21 ng/mL (ref 11–307)

## 2019-04-28 NOTE — Progress Notes (Signed)
Patient contacted for telehealth visit. Patient states that she had not had period for 6 months and finally had one this past weekend. She had very heavy bleeding for the first few days.

## 2019-04-29 NOTE — Progress Notes (Signed)
HEMATOLOGY-ONCOLOGY TeleHEALTH VISIT PROGRESS NOTE  I connected with Eileen Ritter on 04/28/19 at  1:45 PM EDT by video enabled telemedicine visit and verified that I am speaking with the correct person using two identifiers. I discussed the limitations, risks, security and privacy concerns of performing an evaluation and management service by telemedicine and the availability of in-person appointments. I also discussed with the patient that there may be a patient responsible charge related to this service. The patient expressed understanding and agreed to proceed.   Other persons participating in the visit and their role in the encounter:  Geraldine Solar, [place], check in patient   Janeann Merl, RN, check in patient.   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   INTERVAL HISTORY Eileen Ritter is a 53 y.o. female who has above history reviewed by me today presents for follow up visit for management of neuroendocrine carcinoma. Problems and complaints are listed below:  Patient underwent US at [facility] on 12/01/2017 duodenal polyp/mass was submucosal on EUS about 1 cm.  A band was placed at the base of the polyp biopsy was taken from the polyp which demonstrated well-differentiated grade 1 neuroendocrine carcinoma.  Patient was told that mass was expected to drop off.  She had a repeat EGD on 06/13/2018 which showed a single 10 mm sessile polyp with no bleeding found in the duodenal bulb.  Patient was referred back to [facility].  Patient underwent EGD at [facility] on 10/10/2018. duodenum submucosal nodule 85mm was seen and was banded again, no specimen was collected.   She was supposed to follow-up with [facility] gastroenterology 1 year after this procedure.  Patient has a history of iron deficiency anemia and she had taken oral iron supplementation. Today she reports doing well.  No new complaints.  Denies  any skin rash, facial flush, diarrhea.  Denies any pain today.  Review of Systems  Constitutional: Negative for appetite change, chills, fatigue and fever.  HENT:   Negative for hearing loss and voice change.   Eyes: Negative for eye problems.  Respiratory: Negative for chest tightness and cough.   Cardiovascular: Negative for chest pain.  Gastrointestinal: Negative for abdominal distention, abdominal pain and blood in stool.  Endocrine: Negative for hot flashes.  Genitourinary: Negative for difficulty urinating and frequency.   Musculoskeletal: Negative for arthralgias.  Skin: Negative for itching and rash.  Neurological: Negative for extremity weakness.  Hematological: Negative for adenopathy.  Psychiatric/Behavioral: Negative for confusion.    Past Medical History:  Diagnosis Date  . Allergic rhinitis   . Anxiety   . Chronic kidney disease   . Depression   . Diabetes ([place])   . GERD (gastroesophageal reflux disease)   . HBP (high blood pressure)   . High cholesterol   . Iron deficiency anemia due to chronic blood loss 01/13/2018  . Obesity    Past Surgical History:  Procedure Laterality Date  . APPENDECTOMY    . COLONOSCOPY WITH PROPOFOL N/A 09/23/2017   Procedure: COLONOSCOPY WITH PROPOFOL;  Surgeon: Jonathon Bellows, MD;  Location: [facility] ENDOSCOPY;  Service: Gastroenterology;  Laterality: N/A;  . CYST EXCISION     back  . ESOPHAGOGASTRODUODENOSCOPY (EGD) WITH PROPOFOL N/A 09/23/2017   Procedure: ESOPHAGOGASTRODUODENOSCOPY (EGD) WITH PROPOFOL;  Surgeon: Jonathon Bellows, MD;  Location[facility] ENDOSCOPY;  Service: Gastroenterology;  Laterality: N/A;  . ESOPHAGOGASTRODUODENOSCOPY (EGD) WITH PROPOFOL N/A 06/13/2018   Procedure: ESOPHAGOGASTRODUODENOSCOPY (EGD) WITH PROPOFOL;  Surgeon: Jonathon Bellows,  MD;  Location: [facility] ENDOSCOPY;  Service: Gastroenterology;  Laterality: N/A;  . GIVENS CAPSULE STUDY N/A 07/20/2018   Procedure: GIVENS CAPSULE STUDY;  Surgeon: Jonathon Bellows, MD;  Location: [facility]  ENDOSCOPY;  Service: Gastroenterology;  Laterality: N/A;  . TONSILLECTOMY    . TUBAL LIGATION      Family History  Problem Relation Age of Onset  . Diabetes Mother   . Heart disease Mother   . Hypertension Mother   . Stroke Mother   . Cancer Mother        LUNG  . Mental illness Mother   . Thyroid disease Mother   . Diabetes Father   . Heart disease Father   . Diabetes Sister   . Diabetes Brother   . Heart disease Brother   . Heart disease Maternal Grandmother   . Breast cancer Neg Hx     Social History   Socioeconomic History  . Marital status: Single    Spouse name: Not on file  . Number of children: Not on file  . Years of education: Not on file  . Highest education level: Not on file  Occupational History  . Not on file  Social Needs  . Financial resource strain: Not on file  . Food insecurity:    Worry: Not on file    Inability: Not on file  . Transportation needs:    Medical: Not on file    Non-medical: Not on file  Tobacco Use  . Smoking status: Former Smoker    Last attempt to quit: 11/01/2013    Years since quitting: 5.4  . Smokeless tobacco: Never Used  Substance and Sexual Activity  . Alcohol use: No  . Drug use: No  . Sexual activity: Yes  Lifestyle  . Physical activity:    Days per week: Not on file    Minutes per session: Not on file  . Stress: Not on file  Relationships  . Social connections:    Talks on phone: Not on file    Gets together: Not on file    Attends religious service: Not on file    Active member of club or organization: Not on file    Attends meetings of clubs or organizations: Not on file    Relationship status: Not on file  . Intimate partner violence:    Fear of current or ex partner: Not on file    Emotionally abused: Not on file    Physically abused: Not on file    Forced sexual activity: Not on file  Other Topics Concern  . Not on file  Social History Narrative  . Not on file    Current Outpatient Medications  on File Prior to Visit  Medication Sig Dispense Refill  . Ascorbic Acid (VITAMIN C) 100 MG tablet Take 100 mg by mouth daily.    Marland Kitchen atorvastatin (LIPITOR) 40 MG tablet TAKE 1 TABLET BY MOUTH EVERY DAY 90 tablet 0  . buPROPion (WELLBUTRIN SR) 150 MG 12 hr tablet TAKE 1 TABLET BY MOUTH EVERY DAY 90 tablet 3  . Cholecalciferol (VITAMIN D) 2000 units tablet Take 2,000 Units by mouth daily.    . cyclobenzaprine (FLEXERIL) 10 MG tablet Take 1 tablet (10 mg total) by mouth as needed for muscle spasms. 90 tablet 1  . diclofenac sodium (VOLTAREN) 1 % GEL Apply 4 g topically 4 (four) times daily. 100 g 12  . Ferrous Sulfate Dried (SLOW RELEASE IRON) 45 MG TBCR Take 45 mg by mouth daily.    Marland Kitchen  FLUoxetine (PROZAC) 20 MG tablet TAKE 1 TABLET BY MOUTH EVERYDAY AT BEDTIME 90 tablet 0  . fluticasone (FLONASE) 50 MCG/ACT nasal spray Place 2 sprays into both nostrils daily.    Marland Kitchen glucose blood test strip Use as instructed 100 each 12  . INVOKANA 300 MG TABS tablet TAKE 1 TABLET (300 MG TOTAL) BY MOUTH DAILY BEFORE BREAKFAST. 90 tablet 3  . losartan (COZAAR) 50 MG tablet TAKE 1 TABLET BY MOUTH EVERY DAY 90 tablet 1  . metFORMIN (GLUCOPHAGE) 500 MG tablet TAKE 2 TABLETS BY MOUTH TWICE A DAY WITH A MEAL 180 tablet 0  . montelukast (SINGULAIR) 10 MG tablet Take 1 tablet (10 mg total) by mouth at bedtime. 30 tablet 3  . omeprazole (PRILOSEC) 20 MG capsule TAKE 1 CAPSULE BY MOUTH EVERY DAY 90 capsule 1  . ondansetron (ZOFRAN) 4 MG tablet Take 1 tablet (4 mg total) by mouth every 8 (eight) hours as needed for nausea or vomiting. 20 tablet 0  . Probiotic Product (PROBIOTIC PO) Take 1 tablet by mouth daily.    . vitamin B-12 (CYANOCOBALAMIN) 100 MCG tablet Take 100 mcg by mouth daily.    . cloNIDine (CATAPRES) 0.1 MG tablet TAKE 1 TABLET (0.1 MG TOTAL) BY MOUTH AT BEDTIME. (Patient not taking: Reported on 04/28/2019) 90 tablet 0  . Dulaglutide (TRULICITY) 4.23 NT/6.1WE SOPN Inject 0.75 mg into the skin once a week. (Patient  not taking: Reported on 04/28/2019) 4 pen 2   No current facility-administered medications on file prior to visit.     No Known Allergies     Observations/Objective: Today's Vitals   04/28/19 1327  BP: (!) 145/85  Pulse: 79  Resp: 20  Temp: 98 F (36.7 C)  TempSrc: Tympanic   There is no height or weight on file to calculate BMI.  Physical Exam  Constitutional: She is oriented to person, place, and time. No distress.  HENT:  Head: Normocephalic and atraumatic.  Pulmonary/Chest: Effort normal.  Neurological: She is alert and oriented to person, place, and time.  Psychiatric: Affect normal.    CBC    Component Value Date/Time   WBC 12.1 (H) 04/28/2019 1319   RBC 5.43 (H) 04/28/2019 1319   HGB 14.5 04/28/2019 1319   HGB 14.2 08/25/2018 1037   HCT 44.7 04/28/2019 1319   HCT 43.8 08/25/2018 1037   PLT 353 04/28/2019 1319   PLT 384 08/25/2018 1037   MCV 82.3 04/28/2019 1319   MCV 81 08/25/2018 1037   MCV 80 07/22/2013 1849   MCH 26.7 04/28/2019 1319   MCHC 32.4 04/28/2019 1319   RDW 13.6 04/28/2019 1319   RDW 13.8 08/25/2018 1037   RDW 14.1 07/22/2013 1849   LYMPHSABS 4.4 (H) 04/28/2019 1319   LYMPHSABS 3.7 (H) 08/25/2018 1037   MONOABS 1.0 04/28/2019 1319   EOSABS 0.4 04/28/2019 1319   EOSABS 0.2 08/25/2018 1037   BASOSABS 0.1 04/28/2019 1319   BASOSABS 0.1 08/25/2018 1037    CMP     Component Value Date/Time   NA 139 03/03/2019 1438   NA 138 07/22/2013 1849   K 4.7 03/03/2019 1438   K 4.2 07/22/2013 1849   CL 96 03/03/2019 1438   CL 106 07/22/2013 1849   CO2 23 03/03/2019 1438   CO2 26 07/22/2013 1849   GLUCOSE 170 (H) 03/03/2019 1438   GLUCOSE 136 (H) 01/13/2018 1425   GLUCOSE 124 (H) 07/22/2013 1849   BUN 13 03/03/2019 1438   BUN 16 07/22/2013 1849  CREATININE 0.69 03/03/2019 1438   CREATININE 0.89 07/22/2013 1849   CALCIUM 9.7 03/03/2019 1438   CALCIUM 9.2 07/22/2013 1849   PROT 6.7 03/03/2019 1438   PROT 7.2 07/22/2013 1849   ALBUMIN 4.4  03/03/2019 1438   ALBUMIN 3.5 07/22/2013 1849   AST 18 03/03/2019 1438   AST 21 07/22/2013 1849   ALT 37 (H) 03/03/2019 1438   ALT 41 07/22/2013 1849   ALKPHOS 97 03/03/2019 1438   ALKPHOS 100 07/22/2013 1849   BILITOT <0.2 03/03/2019 1438   BILITOT 0.2 07/22/2013 1849   GFRNONAA 102 03/03/2019 1438   GFRNONAA >60 07/22/2013 1849   GFRAA 117 03/03/2019 1438   GFRAA >60 07/22/2013 1849     Assessment and Plan: 1. 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 Iron deficiency anemia due to chronic blood loss   3. Lymphocytosis    Labs are reviewed and discussed with patient.  Hemoglobin is stable and iron panel revealed acceptable iron store.  No need for IV iron treatment.   Mild leukocytosis, mainly lymphocytosis, chronic since 2018,  ?reactive will check flowcytometry.   History of Stage I [id number] Duodenum neuroendocrine carcinoma, grade 1, s/p banding.  Chronically elevated chromogranin A level. Normal baseline 5 HIAA.  Recommend checking Chromogranin A every 6 months.  Repeat EGD with 1 year interval, due Oct 2020, recommend patient to make follow up appt with [facility] gastroenterology.    Follow Up Instructions: Check flowcytometry in 3 months.  Lab MD in 6 months.   Orders Placed This Encounter  Procedures  . Chromogranin A    Standing Status:   Future    Standing Expiration Date:   04/27/2020  . CBC with Differential/Platelet    Standing Status:   Future    Standing Expiration Date:   04/27/2020  . Ferritin    Standing Status:   Future    Standing Expiration Date:   04/27/2020  . Iron and TIBC    Standing Status:   Future    Standing Expiration Date:   04/27/2020  . 5 HIAA, quantitative, urine, 24 hour    Standing Status:   Future    Standing Expiration Date:   04/28/2020  . Flow cytometry panel-leukemia/lymphoma work-up    Standing Status:   Future    Standing Expiration Date:   04/28/2020     I discussed the assessment and treatment plan with the patient. The patient  was provided an opportunity to ask questions and all were answered. The patient agreed with the plan and demonstrated an understanding of the instructions.  The patient was advised to call back or seek an in-person evaluation if the symptoms worsen or if the condition fails to improve as anticipated.   I provided 16 minutes of face-to-face video visit time during this encounter, and > 50% was spent counseling as documented under my assessment & plan.  Earlie Server, MD 04/29/2019 12:13 AM

## 2019-05-02 LAB — CHROMOGRANIN A REBASELINE
Chromogranin A (ng/mL): 220.9 ng/mL — ABNORMAL HIGH (ref 0.0–101.8)
Chromogranin A: 9 nmol/L — ABNORMAL HIGH (ref 0–5)

## 2019-05-13 ENCOUNTER — Other Ambulatory Visit: Payer: Self-pay | Admitting: Unknown Physician Specialty

## 2019-05-21 ENCOUNTER — Other Ambulatory Visit: Payer: Self-pay | Admitting: Family Medicine

## 2019-05-21 NOTE — Telephone Encounter (Signed)
Requested Prescriptions  Pending Prescriptions Disp Refills  . metFORMIN (GLUCOPHAGE) 500 MG tablet [Pharmacy Med Name: METFORMIN HCL 500 MG TABLET] 180 tablet 0    Sig: TAKE 2 TABLETS BY MOUTH TWICE A DAY WITH A MEAL     Endocrinology:  Diabetes - Biguanides Passed - 05/21/2019  9:12 AM      Passed - Cr in normal range and within 360 days    Creatinine  Date Value Ref Range Status  07/22/2013 0.89 0.60 - 1.30 mg/dL Final   Creatinine, Ser  Date Value Ref Range Status  03/03/2019 0.69 0.57 - 1.00 mg/dL Final         Passed - HBA1C is between 0 and 7.9 and within 180 days    Hemoglobin A1C  Date Value Ref Range Status  10/26/2016 6.9%  Final   HB A1C (BAYER DCA - WAIVED)  Date Value Ref Range Status  08/01/2018 7.1 (H) <7.0 % Final    Comment:                                          Diabetic Adult            <7.0                                       Healthy Adult        4.3 - 5.7                                                           (DCCT/NGSP) American Diabetes Association's Summary of Glycemic Recommendations for Adults with Diabetes: Hemoglobin A1c <7.0%. More stringent glycemic goals (A1c <6.0%) may further reduce complications at the cost of increased risk of hypoglycemia.    Hgb A1c MFr Bld  Date Value Ref Range Status  03/03/2019 7.8 (H) 4.8 - 5.6 % Final    Comment:             Prediabetes: 5.7 - 6.4          Diabetes: >6.4          Glycemic control for adults with diabetes: <7.0          Passed - eGFR in normal range and within 360 days    EGFR (African American)  Date Value Ref Range Status  07/22/2013 >60  Final   GFR calc Af Amer  Date Value Ref Range Status  03/03/2019 117 >59 mL/min/1.73 Final   EGFR (Non-African Amer.)  Date Value Ref Range Status  07/22/2013 >60  Final    Comment:    eGFR values <68m/min/1.73 m2 may be an indication of chronic kidney disease (CKD). Calculated eGFR is useful in patients with stable renal function. The  eGFR calculation will not be reliable in acutely ill patients when serum creatinine is changing rapidly. It is not useful in  patients on dialysis. The eGFR calculation may not be applicable to patients at the low and high extremes of body sizes, pregnant women, and vegetarians.    GFR calc non Af Amer  Date Value Ref Range Status  03/03/2019 102 >59 mL/min/1.73  Final         Passed - Valid encounter within last 6 months    Recent Outpatient Visits          2 months ago Uncontrolled type 2 diabetes mellitus with hyperglycemia [facility])   [facility] Volney American, [place]   4 months ago Vaginal candidiasis   [facility] Merrie Roof [place], [place]   5 months ago Uncontrolled type 2 diabetes mellitus with hyperglycemia [facility])   [place], [place], [place]   8 months ago Essential hypertension   [place], [place], [place]   9 months ago Uncontrolled type 2 diabetes mellitus with hyperglycemia [facility])   [place], [place], [place]

## 2019-05-21 NOTE — Telephone Encounter (Signed)
Requested Prescriptions  Pending Prescriptions Disp Refills  . omeprazole (PRILOSEC) 20 MG capsule [Pharmacy Med Name: OMEPRAZOLE DR 20 MG CAPSULE] 90 capsule 1    Sig: TAKE 1 CAPSULE BY MOUTH EVERY DAY     Gastroenterology: Proton Pump Inhibitors Passed - 05/21/2019 10:39 AM      Passed - Valid encounter within last 12 months    Recent Outpatient Visits          2 months ago Uncontrolled type 2 diabetes mellitus with hyperglycemia [facility])   [facility] Volney American, [place]   4 months ago Vaginal candidiasis   [facility] Merrie Roof [place], [place]   5 months ago Uncontrolled type 2 diabetes mellitus with hyperglycemia [facility])   [facility] Volney American, [place]   8 months ago Essential hypertension   [place], [place], [place]   9 months ago Uncontrolled type 2 diabetes mellitus with hyperglycemia [facility])   [facility] Volney American, [place]

## 2019-05-22 ENCOUNTER — Other Ambulatory Visit: Payer: Self-pay | Admitting: Family Medicine

## 2019-05-23 ENCOUNTER — Encounter: Payer: Self-pay | Admitting: Family Medicine

## 2019-05-23 ENCOUNTER — Emergency Department: Payer: Commercial Managed Care - PPO

## 2019-05-23 ENCOUNTER — Encounter: Payer: Self-pay | Admitting: *Deleted

## 2019-05-23 ENCOUNTER — Other Ambulatory Visit: Payer: Self-pay

## 2019-05-23 ENCOUNTER — Emergency Department
Admission: EM | Admit: 2019-05-23 | Discharge: 2019-05-23 | Disposition: A | Discharge status: Home / Self Care | Payer: Commercial Managed Care - PPO | Attending: Emergency Medicine | Admitting: Emergency Medicine

## 2019-05-23 DIAGNOSIS — G510 Bell's palsy: Secondary | ICD-10-CM | POA: Diagnosis not present

## 2019-05-23 DIAGNOSIS — Z79899 Other long term (current) drug therapy: Secondary | ICD-10-CM | POA: Insufficient documentation

## 2019-05-23 DIAGNOSIS — N189 Chronic kidney disease, unspecified: Secondary | ICD-10-CM | POA: Diagnosis not present

## 2019-05-23 DIAGNOSIS — Z87891 Personal history of nicotine dependence: Secondary | ICD-10-CM | POA: Diagnosis not present

## 2019-05-23 DIAGNOSIS — R29810 Facial weakness: Secondary | ICD-10-CM | POA: Diagnosis present

## 2019-05-23 DIAGNOSIS — I129 Hypertensive chronic kidney disease with stage 1 through stage 4 chronic kidney disease, or unspecified chronic kidney disease: Secondary | ICD-10-CM | POA: Diagnosis not present

## 2019-05-23 DIAGNOSIS — E119 Type 2 diabetes mellitus without complications: Secondary | ICD-10-CM | POA: Diagnosis not present

## 2019-05-23 LAB — COMPREHENSIVE METABOLIC PANEL
ALT: 48 U/L — ABNORMAL HIGH (ref 0–44)
AST: 31 U/L (ref 15–41)
Albumin: 4.3 g/dL (ref 3.5–5.0)
Alkaline Phosphatase: 90 U/L (ref 38–126)
Anion gap: 15 (ref 5–15)
BUN: 17 mg/dL (ref 6–20)
CO2: 23 mmol/L (ref 22–32)
Calcium: 9.4 mg/dL (ref 8.9–10.3)
Chloride: 99 mmol/L (ref 98–111)
Creatinine, Ser: 0.74 mg/dL (ref 0.44–1.00)
GFR calc Af Amer: >60 mL/min (ref >60)
GFR calc non Af Amer: >60 mL/min (ref >60)
Glucose, Bld: 215 mg/dL — ABNORMAL HIGH (ref 70–99)
Potassium: 4.1 mmol/L (ref 3.5–5.1)
Sodium: 137 mmol/L (ref 135–145)
Total Bilirubin: 0.6 mg/dL (ref 0.3–1.2)
Total Protein: 7.4 g/dL (ref 6.5–8.1)

## 2019-05-23 LAB — CBC
HCT: 46 % (ref 36.0–46.0)
Hemoglobin: 15 g/dL (ref 12.0–15.0)
MCH: 26.3 pg (ref 26.0–34.0)
MCHC: 32.6 g/dL (ref 30.0–36.0)
MCV: 80.6 fL (ref 80.0–100.0)
Platelets: 393 10*3/uL (ref 150–400)
RBC: 5.71 MIL/uL — ABNORMAL HIGH (ref 3.87–5.11)
RDW: 13.5 % (ref 11.5–15.5)
WBC: 15.8 10*3/uL — ABNORMAL HIGH (ref 4.0–10.5)
nRBC: 0 % (ref 0.0–0.2)

## 2019-05-23 LAB — DIFFERENTIAL
Abs Immature Granulocytes: 0.07 10*3/uL (ref 0.00–0.07)
Basophils Absolute: 0.1 10*3/uL (ref 0.0–0.1)
Basophils Relative: 1 %
Eosinophils Absolute: 0.4 10*3/uL (ref 0.0–0.5)
Eosinophils Relative: 3 %
Immature Granulocytes: 0 %
Lymphocytes Relative: 36 %
Lymphs Abs: 5.6 10*3/uL — ABNORMAL HIGH (ref 0.7–4.0)
Monocytes Absolute: 1.2 10*3/uL — ABNORMAL HIGH (ref 0.1–1.0)
Monocytes Relative: 7 %
Neutro Abs: 8.4 10*3/uL — ABNORMAL HIGH (ref 1.7–7.7)
Neutrophils Relative %: 53 %
Smear Review: NORMAL

## 2019-05-23 MED ORDER — LORAZEPAM 1 MG PO TABS: 1.5000 mg | ORAL_TABLET | Freq: Once | ORAL | Status: DC

## 2019-05-23 MED ORDER — PREDNISONE 20 MG PO TABS
40.0000 mg | ORAL_TABLET | Freq: Once | ORAL | Status: AC
  Administered 2019-05-23: 40 mg via ORAL
  Filled 2019-05-23: qty 2

## 2019-05-23 MED ORDER — PREDNISONE 20 MG PO TABS: 40.0000 mg | ORAL_TABLET | Freq: Every day | ORAL | 0 refills | Status: DC

## 2019-05-23 NOTE — ED Provider Notes (Signed)
[facility] Emergency Department Provider Note   ____________________________________________   First MD Initiated Contact with Patient 05/23/19 2059     (approximate)  I have reviewed the triage vital signs and the nursing notes.   HISTORY  Chief Complaint Facial Droop    HPI Eileen Ritter is a 53 y.o. female history of diabetes  Yesterday when the patient woke up from bed she noticed that her left eye seemed a little dry, when she tried to eat things her left side of her face felt weak.  She has any trouble speaking.  She has had no weakness in her arms or legs.  She reports she can feel things normally across the face.  There is been no visual changes  She has had a sinus infection over about the last month and a half, has been treated with antibiotics by her primary does not have any fevers but continues to have congestion and a feeling like there is "fluid behind left ear"  No lesions or rashes.  No cold sores.  No history of shingles   Past Medical History:  Diagnosis Date  . Allergic rhinitis   . Anxiety   . Chronic kidney disease   . Depression   . Diabetes ([place])   . GERD (gastroesophageal reflux disease)   . HBP (high blood pressure)   . High cholesterol   . Iron deficiency anemia due to chronic blood loss 01/13/2018  . Obesity     Patient Active Problem List   Diagnosis Date Noted  . 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 Iron deficiency anemia due to chronic blood loss 01/13/2018  . Menorrhagia 10/04/2017  . B12 deficiency 10/04/2017  . Iron deficiency anemia 07/28/2017  . Hair thinning 12/18/2016  . Uncontrolled type 2 diabetes mellitus ([place]) 09/11/2015  . GERD (gastroesophageal reflux disease) 05/30/2015  . Diabetes ([place]) 05/30/2015  . HTN (hypertension) 05/30/2015  . Hyperlipidemia 05/30/2015  . Allergic rhinitis 05/30/2015  . Depression 05/30/2015  . Morbid obesity ([place]) 05/30/2015  . Chronic kidney  disease 05/30/2015  . Peripheral neuropathy 05/30/2015  . Arthritis of knee, degenerative 05/30/2015    Past Surgical History:  Procedure Laterality Date  . APPENDECTOMY    . COLONOSCOPY WITH PROPOFOL N/A 09/23/2017   Procedure: COLONOSCOPY WITH PROPOFOL;  Surgeon: Jonathon Bellows, MD;  Location: [facility] ENDOSCOPY;  Service: Gastroenterology;  Laterality: N/A;  . CYST EXCISION     back  . ESOPHAGOGASTRODUODENOSCOPY (EGD) WITH PROPOFOL N/A 09/23/2017   Procedure: ESOPHAGOGASTRODUODENOSCOPY (EGD) WITH PROPOFOL;  Surgeon: Jonathon Bellows, MD;  Location: [facility] ENDOSCOPY;  Service: Gastroenterology;  Laterality: N/A;  . ESOPHAGOGASTRODUODENOSCOPY (EGD) WITH PROPOFOL N/A 06/13/2018   Procedure: ESOPHAGOGASTRODUODENOSCOPY (EGD) WITH PROPOFOL;  Surgeon: Jonathon Bellows, MD;  Location: [facility] ENDOSCOPY;  Service: Gastroenterology;  Laterality: N/A;  . GIVENS CAPSULE STUDY N/A 07/20/2018   Procedure: GIVENS CAPSULE STUDY;  Surgeon: Jonathon Bellows, MD;  Location: [facility] ENDOSCOPY;  Service: Gastroenterology;  Laterality: N/A;  . TONSILLECTOMY    . TUBAL LIGATION      Prior to Admission medications   Medication Sig Start Date End Date Taking? Authorizing Provider  Ascorbic Acid (VITAMIN C) 100 MG tablet Take 100 mg by mouth daily.    [provider]  atorvastatin (LIPITOR) 40 MG tablet TAKE 1 TABLET BY MOUTH EVERY DAY 05/13/19   Volney American, PA-C  buPROPion [facility] SR) 150 MG 12 hr tablet TAKE 1 TABLET BY MOUTH EVERY DAY 06/22/18   Kathrine Haddock, NP  Cholecalciferol (VITAMIN  D) 2000 units tablet Take 2,000 Units by mouth daily.    [provider]  cloNIDine (CATAPRES) 0.1 MG tablet TAKE 1 TABLET (0.1 MG TOTAL) BY MOUTH AT BEDTIME. Patient not taking: Reported on 04/28/2019 03/26/19   Volney American, PA-C  cyclobenzaprine (FLEXERIL) 10 MG tablet Take 1 tablet (10 mg total) by mouth as needed for muscle spasms. 09/11/15   Kathrine Haddock, NP  diclofenac sodium (VOLTAREN) 1 % GEL Apply 4 g  topically 4 (four) times daily. 05/03/18   Kathrine Haddock, NP  Dulaglutide (TRULICITY) 8.11 BJ/4.7WG SOPN Inject 0.75 mg into the skin once a week. Patient not taking: Reported on 04/28/2019 03/03/19   Volney American, PA-C  Ferrous Sulfate Dried (SLOW RELEASE IRON) 45 MG TBCR Take 45 mg by mouth daily.    [provider]  FLUoxetine (PROZAC) 20 MG tablet TAKE 1 TABLET BY MOUTH EVERYDAY AT BEDTIME 02/24/19   Volney American, PA-C  fluticasone [facility]) 50 MCG/ACT nasal spray Place 2 sprays into both nostrils daily.    [provider]  glucose blood test strip Use as instructed 12/30/18   Volney American, PA-C  INVOKANA 300 MG TABS tablet TAKE 1 TABLET (300 MG TOTAL) BY MOUTH DAILY BEFORE BREAKFAST. 05/22/19   Johnson, Megan P, DO  losartan (COZAAR) 50 MG tablet TAKE 1 TABLET BY MOUTH EVERY DAY 02/27/19   Johnson, Megan P, DO  metFORMIN (GLUCOPHAGE) 500 MG tablet TAKE 2 TABLETS BY MOUTH TWICE A DAY WITH A MEAL 05/21/19   Volney American, PA-C  montelukast (SINGULAIR) 10 MG tablet Take 1 tablet (10 mg total) by mouth at bedtime. 03/03/19   Volney American, PA-C  omeprazole (PRILOSEC) 20 MG capsule TAKE 1 CAPSULE BY MOUTH EVERY DAY 05/21/19   Volney American, PA-C  ondansetron (ZOFRAN) 4 MG tablet Take 1 tablet (4 mg total) by mouth every 8 (eight) hours as needed for nausea or vomiting. 06/17/18   Kathrine Haddock, NP  predniSONE (DELTASONE) 20 MG tablet Take 2 tablets (40 mg total) by mouth daily. 05/23/19   Delman Kitten, MD  Probiotic Product (PROBIOTIC PO) Take 1 tablet by mouth daily.    [provider]  vitamin B-12 (CYANOCOBALAMIN) 100 MCG tablet Take 100 mcg by mouth daily.    [provider]    Allergies Patient has no known allergies.  Family History  Problem Relation Age of Onset  . Diabetes Mother   . Heart disease Mother   . Hypertension Mother   . Stroke Mother   . Cancer Mother        LUNG  . Mental illness Mother   .  Thyroid disease Mother   . Diabetes Father   . Heart disease Father   . Diabetes Sister   . Diabetes Brother   . Heart disease Brother   . Heart disease Maternal Grandmother   . Breast cancer Neg Hx     Social History Social History   Tobacco Use  . Smoking status: Former Smoker    Last attempt to quit: 11/01/2013    Years since quitting: 5.5  . Smokeless tobacco: Never Used  Substance Use Topics  . Alcohol use: No  . Drug use: No    Review of Systems Constitutional: No fever/chills Eyes: No visual changes but the left eye is somewhat dry. ENT: No sore throat. Cardiovascular: Denies chest pain. Respiratory: Denies shortness of breath. Gastrointestinal: No abdominal pain.   Genitourinary: Negative for dysuria. Musculoskeletal: Negative for back  pain. Skin: Negative for rash. Neurological: Negative for headaches, areas of focal weakness or numbness except a little bit of drooping of her left side of her face.    ____________________________________________   PHYSICAL EXAM:  VITAL SIGNS: ED Triage Vitals  Enc Vitals Group     BP 05/23/19 1753 (!) 168/106     Pulse Rate 05/23/19 1753 (!) 111     Resp 05/23/19 1753 16     Temp 05/23/19 1753 98.2 F (36.8 C)     Temp Source 05/23/19 1753 Oral     SpO2 05/23/19 1753 95 %     Weight 05/23/19 1755 280 lb (127 kg)     Height 05/23/19 1755 5\' 2"  (1.575 m)     Head Circumference --      Peak Flow --      Pain Score 05/23/19 1754 0     Pain Loc --      Pain Edu? --      Excl. in [place]? --     Constitutional: Alert and oriented. Well appearing and in no acute distress.  He is quite pleasant. Eyes: Conjunctivae are normal. Head: Atraumatic.  Extraocular movements are normal. Nose: No congestion/rhinnorhea. Mouth/Throat: Mucous membranes are moist. Neck: No stridor.  Cardiovascular: Normal rate, regular rhythm. Grossly normal heart sounds.  Good peripheral circulation. Respiratory: Normal respiratory effort.  No  retractions. Lungs CTAB. Gastrointestinal: Soft and nontender. No distention. Musculoskeletal: No lower extremity tenderness nor edema. Neurologic:  Normal speech and language. No gross focal neurologic deficits are appreciated except for the left 7th nerve facial palsy.  Patient is not able to furrow the left forehead well, she also has weakness to left side of the face is smiling and closing of the left eye.  He has clear normal speech.  Extraocular movements are normal.  No pronator drift.  5-5 strength with normal sensation in all extremities. Skin:  Skin is warm, dry and intact. No rash noted. Psychiatric: Mood and affect are normal. Speech and behavior are normal.  ____________________________________________   LABS (all labs ordered are listed, but only abnormal results are displayed)  Labs Reviewed  CBC - Abnormal; Notable for the following components:      Result Value   WBC 15.8 (*)    RBC 5.71 (*)    All other components within normal limits  DIFFERENTIAL - Abnormal; Notable for the following components:   Neutro Abs 8.4 (*)    Lymphs Abs 5.6 (*)    Monocytes Absolute 1.2 (*)    All other components within normal limits  COMPREHENSIVE METABOLIC PANEL - Abnormal; Notable for the following components:   Glucose, Bld 215 (*)    ALT 48 (*)    All other components within normal limits   ____________________________________________  EKG  Reviewed interpreted at 1800 Heart rate 110 QRS 90 QTc 470 Sinus tachycardia without ischemia ____________________________________________  RADIOLOGY  CT of the head normal  Of note, I did discuss obtaining MRI with the patient but she reports severe claustrophobia and understands that we may not pick up a subtle stroke on CT scan, but she is okay with this as she reports severe claustrophobia to the point that she really cannot have an MRI without severe severe anxiety, of note she actually started sweating slightly when we talk about  it.  She does not wish and we discussed benefits of MRI of her CT scan but, with medical decision making and taking her preferences and will count a CT  the head is negative ____________________________________________   PROCEDURES  Procedure(s) performed: None  Procedures  Critical Care performed: No  ____________________________________________   INITIAL IMPRESSION / ASSESSMENT AND PLAN / ED COURSE  Pertinent labs & imaging results that were available during my care of the patient were reviewed by me and considered in my medical decision making (see chart for details).   Clinical examination and history appear consistent with a Bell's palsy.  She is able to close the left eyes pretty well, not fully reports some dryness.  She has no signs or symptoms of a shingles or HSV type outbreak.  She does have an a chronic sinusitis on the left side she is reporting, but there is no evidence of acute sinusitis on CT scan and her CT is normal without evidence of stroke or mass.  Discussed with the patient she will trial prednisone 5-day course, she will follow-up closely with her primary doctor has appointment actually scheduled already for tomorrow, though she may reschedule this to later given her evaluation today.  Patient comfortable plan for careful outpatient follow-up.  She has no signs or symptoms to suggest a stroke.  She has no headache or signs or symptoms of suggest a central venous sinus thrombosis or other emergent cause I suspect her Bell's palsy likely but unclear etiology, or possibly related to her diabetes  Return precautions and treatment recommendations and follow-up discussed with the patient who is agreeable with the plan.       ____________________________________________   FINAL CLINICAL IMPRESSION(S) / ED DIAGNOSES  Final diagnoses:  Left-sided Bell's palsy        Note:  This document was prepared using Dragon voice recognition software and may include  unintentional dictation errors       Delman Kitten, MD 05/23/19 2236

## 2019-05-23 NOTE — ED Notes (Signed)
Sent rainbow to lab. 

## 2019-05-23 NOTE — ED Triage Notes (Signed)
PT to ED reporting facial droop since yesterday at 6:00 in the morning. Pt has right sided facial droop noted in triage with a "tickling feeling" pt denies numbness completely. Pt reporting last month pt had a significant ear infection and sinusitis. No drooping noted around pts eye. No weakness. No other neuro deficits.   Pt reports anxiety and feeling as though she may have nan anxiety attack soon.

## 2019-05-24 ENCOUNTER — Other Ambulatory Visit: Payer: Self-pay

## 2019-05-24 ENCOUNTER — Encounter: Payer: Self-pay | Admitting: Family Medicine

## 2019-05-24 ENCOUNTER — Ambulatory Visit (INDEPENDENT_AMBULATORY_CARE_PROVIDER_SITE_OTHER): Payer: Commercial Managed Care - PPO | Admitting: Family Medicine

## 2019-05-24 VITALS — BP 149/79 | HR 73 | Temp 98.0°F | Ht 62.0 in | Wt 287.0 lb

## 2019-05-24 DIAGNOSIS — I10 Essential (primary) hypertension: Secondary | ICD-10-CM

## 2019-05-24 DIAGNOSIS — J3089 Other allergic rhinitis: Secondary | ICD-10-CM | POA: Diagnosis not present

## 2019-05-24 DIAGNOSIS — G510 Bell's palsy: Secondary | ICD-10-CM

## 2019-05-24 MED ORDER — DAPAGLIFLOZIN PROPANEDIOL 10 MG PO TABS: 10.0000 mg | ORAL_TABLET | Freq: Every day | ORAL | 1 refills | Status: DC

## 2019-05-24 MED ORDER — VALACYCLOVIR HCL 1 G PO TABS: 1000.0000 mg | ORAL_TABLET | Freq: Three times a day (TID) | ORAL | 0 refills | Status: DC

## 2019-05-24 MED ORDER — CLONAZEPAM 0.5 MG PO TABS: 0.5000 mg | ORAL_TABLET | Freq: Every day | ORAL | 0 refills | Status: DC | PRN

## 2019-05-24 NOTE — Progress Notes (Signed)
BP (!) 149/79   Pulse 73   Temp 98 F (36.7 C) (Oral)   Ht 5\' 2"  (1.575 m)   Wt 287 lb (130.2 kg)   SpO2 97%   BMI 52.49 kg/m    Subjective:    Patient ID: Eileen Ritter, female    DOB: 1965/12/18, 53 y.o.   MRN: [id number]  HPI: Eileen Ritter is a 53 y.o. female  Chief Complaint  Patient presents with  . Ear discomfort    left side since long time ago that patient thinks is causing the face drooping. went to ED yesterday   Here today for ER f/u for bell's palsy affecting right side of face. Noticed a droop and numbness in right side of mouth that alarmed her so went to ER for evaluation. Was given prednisone and d/c'd. Some mild improvement since but still having trouble eating, drinking. Denies new sxs.   Left ear continues to have pressure, popping. Allergies still under poor control despite compliance with antihistamine, singulair, flonase regimen. Denies fevers, chills, sinus pain, sick contacts.   HTN - Readings elevated with all the increased stress lately. Taking her losartan faithfully. Denies CP, SOB, HAs, dizziness.   Anxiety - on prozac and wellbutrin currently which does help but has been under extreme stress right now. Gets SOB, feels unfocused, can't bring herself down. Took xanax and klonopin in the past as needed which she tolerated very well. Denies SI/HI.  Depression screen [facility] 2/9 03/03/2019 08/25/2018 08/01/2018  Decreased Interest 0 0 0  Down, Depressed, Hopeless 2 0 1  PHQ - 2 Score 2 0 1  Altered sleeping 1 1 0  Tired, decreased energy 1 1 0  Change in appetite 1 0 0  Feeling bad or failure about yourself  1 0 0  Trouble concentrating 0 0 0  Moving slowly or fidgety/restless 0 0 0  Suicidal thoughts 0 0 0  PHQ-9 Score 6 2 1    GAD 7 : Generalized Anxiety Score 08/25/2018  Nervous, Anxious, on Edge 1  Control/stop worrying 1  Worry too much - different things 1  Trouble relaxing 1  Restless 0  Easily annoyed or irritable 1  Afraid  - awful might happen 0  Total GAD 7 Score 5   Relevant past medical, surgical, family and social history reviewed and updated as indicated. Interim medical history since our last visit reviewed. Allergies and medications reviewed and updated.  Review of Systems  Per HPI unless specifically indicated above     Objective:    BP (!) 149/79   Pulse 73   Temp 98 F (36.7 C) (Oral)   Ht 5\' 2"  (1.575 m)   Wt 287 lb (130.2 kg)   SpO2 97%   BMI 52.49 kg/m   Wt Readings from Last 3 Encounters:  05/24/19 287 lb (130.2 kg)  05/23/19 280 lb (127 kg)  12/30/18 276 lb (125.2 kg)    Physical Exam Vitals signs and nursing note reviewed.  Constitutional:      Appearance: Normal appearance. She is not ill-appearing.  HENT:     Head: Atraumatic.     Ears:     Comments: B/l middle ear effusion    Nose: Rhinorrhea present.     Mouth/Throat:     Pharynx: Posterior oropharyngeal erythema present.  Eyes:     Extraocular Movements: Extraocular movements intact.     Conjunctiva/sclera: Conjunctivae normal.  Neck:     Musculoskeletal: Normal range of motion and neck  supple.  Cardiovascular:     Rate and Rhythm: Normal rate and regular rhythm.     Heart sounds: Normal heart sounds.  Pulmonary:     Effort: Pulmonary effort is normal.     Breath sounds: Normal breath sounds.  Musculoskeletal: Normal range of motion.  Skin:    General: Skin is warm and dry.  Neurological:     Mental Status: She is alert and oriented to person, place, and time.     Comments: Mild right lower facial droop most evident in mouth. No other deficits noted  Psychiatric:        Mood and Affect: Mood normal.        Thought Content: Thought content normal.        Judgment: Judgment normal.     Results for orders placed or performed during the hospital encounter of 05/23/19  CBC  Result Value Ref Range   WBC 15.8 (H) 4.0 - 10.5 K/uL   RBC 5.71 (H) 3.87 - 5.11 MIL/uL   Hemoglobin 15.0 12.0 - 15.0 g/dL   HCT  46.0 36.0 - 46.0 %   MCV 80.6 80.0 - 100.0 fL   MCH 26.3 26.0 - 34.0 pg   MCHC 32.6 30.0 - 36.0 g/dL   RDW 13.5 11.5 - 15.5 %   Platelets 393 150 - 400 K/uL   nRBC 0.0 0.0 - 0.2 %  Differential  Result Value Ref Range   Neutrophils Relative % 53 %   Neutro Abs 8.4 (H) 1.7 - 7.7 K/uL   Lymphocytes Relative 36 %   Lymphs Abs 5.6 (H) 0.7 - 4.0 K/uL   Monocytes Relative 7 %   Monocytes Absolute 1.2 (H) 0.1 - 1.0 K/uL   Eosinophils Relative 3 %   Eosinophils Absolute 0.4 0.0 - 0.5 K/uL   Basophils Relative 1 %   Basophils Absolute 0.1 0.0 - 0.1 K/uL   WBC Morphology MORPHOLOGY UNREMARKABLE    RBC Morphology MORPHOLOGY UNREMARKABLE    Smear Review Normal platelet morphology    Immature Granulocytes 0 %   Abs Immature Granulocytes 0.07 0.00 - 0.07 K/uL  Comprehensive metabolic panel  Result Value Ref Range   Sodium 137 135 - 145 mmol/L   Potassium 4.1 3.5 - 5.1 mmol/L   Chloride 99 98 - 111 mmol/L   CO2 23 22 - 32 mmol/L   Glucose, Bld 215 (H) 70 - 99 mg/dL   BUN 17 6 - 20 mg/dL   Creatinine, Ser 0.74 0.44 - 1.00 mg/dL   Calcium 9.4 8.9 - 10.3 mg/dL   Total Protein 7.4 6.5 - 8.1 g/dL   Albumin 4.3 3.5 - 5.0 g/dL   AST 31 15 - 41 U/L   ALT 48 (H) 0 - 44 U/L   Alkaline Phosphatase 90 38 - 126 U/L   Total Bilirubin 0.6 0.3 - 1.2 mg/dL   GFR calc non Af Amer >60 >60 mL/min   GFR calc Af Amer >60 >60 mL/min   Anion gap 15 5 - 15      Assessment & Plan:   Problem List Items Addressed This Visit      Cardiovascular and Mediastinum   HTN (hypertension)    Stress reduction tactics reviewed, DASH diet, continue to monitor closely. Will adjust if not coming back down in near future        Respiratory   Allergic rhinitis    Under poor control despite excellent compliance with good regimen. Will consider Allergist referral in the future  once other things settle down if still under poor control       Other Visit Diagnoses    Right-sided Bell's palsy    -  Primary   Start  valtrex, continue prednisone. F/u if not resolving   Relevant Medications   clonazePAM (KLONOPIN) 0.5 MG tablet       Follow up plan: Return for as scheduled.

## 2019-05-25 ENCOUNTER — Other Ambulatory Visit: Payer: Self-pay | Admitting: Family Medicine

## 2019-05-25 NOTE — Telephone Encounter (Signed)
Requested Prescriptions  Pending Prescriptions Disp Refills  . Fluoxetine HCl, PMDD, 20 MG TABS [Pharmacy Med Name: FLUOXETINE HCL 20 MG TABLET] 90 tablet 0    Sig: TAKE 1 TABLET BY MOUTH EVERYDAY AT BEDTIME     Psychiatry:  Antidepressants - SSRI Passed - 05/25/2019 11:36 AM      Passed - Valid encounter within last 6 months    Recent Outpatient Visits          [place], [place], [place]   2 months ago Uncontrolled type 2 diabetes mellitus with hyperglycemia [facility])   [facility] Volney American, [place]   4 months ago Vaginal candidiasis   [facility] Merrie Roof [place], [place]   5 months ago Uncontrolled type 2 diabetes mellitus with hyperglycemia [facility])   [place], [place], [place]   9 months ago Essential hypertension   [place], [place], [place]             Passed - Completed PHQ-2 or PHQ-9 in the last 360 days.

## 2019-05-26 ENCOUNTER — Other Ambulatory Visit: Payer: Self-pay | Admitting: Family Medicine

## 2019-05-29 ENCOUNTER — Encounter: Payer: Self-pay | Admitting: Family Medicine

## 2019-05-29 ENCOUNTER — Other Ambulatory Visit: Payer: Self-pay | Admitting: Family Medicine

## 2019-05-29 MED ORDER — AMOXICILLIN-POT CLAVULANATE 875-125 MG PO TABS: 1.0000 | ORAL_TABLET | Freq: Two times a day (BID) | ORAL | 0 refills | Status: DC

## 2019-05-29 NOTE — Assessment & Plan Note (Signed)
Stress reduction tactics reviewed, DASH diet, continue to monitor closely. Will adjust if not coming back down in near future

## 2019-05-29 NOTE — Assessment & Plan Note (Signed)
Under poor control despite excellent compliance with good regimen. Will consider Allergist referral in the future once other things settle down if still under poor control

## 2019-06-02 ENCOUNTER — Encounter: Payer: Self-pay | Admitting: Family Medicine

## 2019-06-02 ENCOUNTER — Other Ambulatory Visit: Payer: Self-pay | Admitting: Family Medicine

## 2019-06-02 MED ORDER — PREDNISONE 20 MG PO TABS: 40.0000 mg | ORAL_TABLET | Freq: Every day | ORAL | 0 refills | Status: DC

## 2019-06-07 ENCOUNTER — Other Ambulatory Visit: Payer: Self-pay | Admitting: Unknown Physician Specialty

## 2019-06-23 ENCOUNTER — Encounter: Payer: Self-pay | Admitting: Family Medicine

## 2019-06-26 ENCOUNTER — Other Ambulatory Visit: Payer: Self-pay | Admitting: Family Medicine

## 2019-06-26 MED ORDER — FLUCONAZOLE 150 MG PO TABS: 150.0000 mg | ORAL_TABLET | Freq: Once | ORAL | 0 refills | Status: AC

## 2019-07-19 ENCOUNTER — Other Ambulatory Visit: Payer: Self-pay | Admitting: Family Medicine

## 2019-07-19 NOTE — Telephone Encounter (Signed)
Requested Prescriptions  Pending Prescriptions Disp Refills  . Fluoxetine HCl, PMDD, 20 MG TABS [Pharmacy Med Name: FLUOXETINE HCL 20 MG TABLET] 30 tablet 0    Sig: TAKE 1 TABLET BY MOUTH EVERYDAY AT BEDTIME     Psychiatry:  Antidepressants - SSRI Passed - 07/19/2019 10:57 AM      Passed - Valid encounter within last 6 months    Recent Outpatient Visits          1 month ago Right-sided Bell's palsy   [facility] Merrie Roof [place], [place]   4 months ago Uncontrolled type 2 diabetes mellitus with hyperglycemia [facility])   [facility] Volney American, [place]   6 months ago Vaginal candidiasis   [facility] Merrie Roof [place], [place]   7 months ago Uncontrolled type 2 diabetes mellitus with hyperglycemia [facility])   [facility] Volney American, [place]   10 months ago Essential hypertension   [place], [place], [place]             Passed - Completed PHQ-2 or PHQ-9 in the last 360 days.      Patient due for follow up 05/2019, was seen for Bell's [place], but not a follow up on chronic medical conditions.  Called patient to advise her to schedule follow up appointment.  Voicemail box full, unable to leave message. 30 day courtesy refill sent.

## 2019-07-21 ENCOUNTER — Ambulatory Visit: Payer: Commercial Managed Care - PPO | Admitting: Family Medicine

## 2019-07-21 ENCOUNTER — Encounter: Payer: Self-pay | Admitting: Family Medicine

## 2019-07-21 ENCOUNTER — Other Ambulatory Visit: Payer: Self-pay

## 2019-07-21 VITALS — BP 123/85 | HR 80 | Temp 98.4°F

## 2019-07-21 DIAGNOSIS — B373 Candidiasis of vulva and vagina: Secondary | ICD-10-CM

## 2019-07-21 DIAGNOSIS — N760 Acute vaginitis: Secondary | ICD-10-CM

## 2019-07-21 DIAGNOSIS — E1165 Type 2 diabetes mellitus with hyperglycemia: Secondary | ICD-10-CM | POA: Diagnosis not present

## 2019-07-21 DIAGNOSIS — N898 Other specified noninflammatory disorders of vagina: Secondary | ICD-10-CM | POA: Diagnosis not present

## 2019-07-21 DIAGNOSIS — B9689 Other specified bacterial agents as the cause of diseases classified elsewhere: Secondary | ICD-10-CM

## 2019-07-21 DIAGNOSIS — B3731 Acute candidiasis of vulva and vagina: Secondary | ICD-10-CM

## 2019-07-21 LAB — WET PREP FOR TRICH, YEAST, CLUE
Clue Cell Exam: POSITIVE — AB
Trichomonas Exam: NEGATIVE
Yeast Exam: POSITIVE — AB

## 2019-07-21 MED ORDER — METRONIDAZOLE 500 MG PO TABS: 500.0000 mg | ORAL_TABLET | Freq: Two times a day (BID) | ORAL | 0 refills | Status: DC

## 2019-07-21 MED ORDER — FLUCONAZOLE 150 MG PO TABS: 150.0000 mg | ORAL_TABLET | Freq: Once | ORAL | 0 refills | Status: AC

## 2019-07-21 NOTE — Progress Notes (Signed)
BP 123/85   Pulse 80   Temp 98.4 F (36.9 C)   SpO2 97%    Subjective:    Patient ID: Eileen Ritter, female    DOB: [birth year], 53 y.o.   MRN: [id number]  HPI: Eileen Ritter is a 53 y.o. female  Chief Complaint  Patient presents with  . Vaginal Discharge   1 week of vaginal itching and burning. States this has been an ongoing intermittently the past few months. Monistat helps very minimally. Denies hematuria, dysuria, fevers, abdominal pain, flank pain. No concern for STIs.   DM - Notes the ozempic isn't on her insurance formulary so she's been unable to afford it. Is still taking the invokana and metformin. Has not been consistent with diet and exercise regimen. Not checking sugars regularly.   Relevant past medical, surgical, family and social history reviewed and updated as indicated. Interim medical history since our last visit reviewed. Allergies and medications reviewed and updated.  Review of Systems  Per HPI unless specifically indicated above     Objective:    BP 123/85   Pulse 80   Temp 98.4 F (36.9 C)   SpO2 97%   Wt Readings from Last 3 Encounters:  05/24/19 287 lb (130.2 kg)  05/23/19 280 lb (127 kg)  12/30/18 276 lb (125.2 kg)    Physical Exam Vitals signs and nursing note reviewed.  Constitutional:      Appearance: Normal appearance. She is not ill-appearing.  HENT:     Head: Atraumatic.  Eyes:     Extraocular Movements: Extraocular movements intact.     Conjunctiva/sclera: Conjunctivae normal.  Neck:     Musculoskeletal: Normal range of motion and neck supple.  Cardiovascular:     Rate and Rhythm: Normal rate and regular rhythm.     Heart sounds: Normal heart sounds.  Pulmonary:     Effort: Pulmonary effort is normal.     Breath sounds: Normal breath sounds.  Abdominal:     General: Bowel sounds are normal.     Palpations: Abdomen is soft. There is no mass.     Tenderness: There is no abdominal tenderness. There is no  right CVA tenderness, left CVA tenderness or guarding.  Musculoskeletal: Normal range of motion.  Skin:    General: Skin is warm and dry.  Neurological:     Mental Status: She is alert and oriented to person, place, and time.  Psychiatric:        Mood and Affect: Mood normal.        Thought Content: Thought content normal.        Judgment: Judgment normal.     Results for orders placed or performed in visit on 07/21/19  WET PREP FOR [place], YEAST, CLUE   Specimen: Vaginal; Sterile Swab   STERILE SWAB  Result Value Ref Range   Trichomonas Exam Negative Negative   Yeast Exam Positive (A) Negative   Clue Cell Exam Positive (A) Negative  HgB A1c  Result Value Ref Range   Hgb A1c MFr Bld 8.9 (H) 4.8 - 5.6 %   Est. average glucose Bld gHb Est-mCnc 209 mg/dL      Assessment & Plan:   Problem List Items Addressed This Visit      Endocrine   Uncontrolled type 2 diabetes mellitus ([place]) - Primary    D/c invokana due to frequent mycotic infections. Will see which GLP 1 is preferred on her formulary and start that. Work on diet and exercise  changes, recheck A1C       Relevant Orders   HgB A1c (Completed)    Other Visit Diagnoses    Vaginal discharge       Relevant Orders   WET PREP FOR [place], YEAST, CLUE (Completed)   BV (bacterial vaginosis)       Tx with flagyl, good vaginal hygiene reviewed, probiotics   Relevant Medications   metroNIDAZOLE (FLAGYL) 500 MG tablet   fluconazole (DIFLUCAN) 150 MG tablet   Vaginal candidiasis       Tx with diflucan once weekly x 4 weeks. Will d/c invokana in case this contributing. Discussed importance of tight BS control for prevention   Relevant Medications   metroNIDAZOLE (FLAGYL) 500 MG tablet   fluconazole (DIFLUCAN) 150 MG tablet       Follow up plan: Return in about 3 months (around 10/21/2019) for DM.

## 2019-07-22 LAB — HEMOGLOBIN A1C
Est. average glucose Bld gHb Est-mCnc: 209 mg/dL
Hgb A1c MFr Bld: 8.9 % — ABNORMAL HIGH (ref 4.8–5.6)

## 2019-07-24 ENCOUNTER — Encounter: Payer: Self-pay | Admitting: Family Medicine

## 2019-07-25 MED ORDER — FLUCONAZOLE 150 MG PO TABS: 150.0000 mg | ORAL_TABLET | ORAL | 0 refills | Status: DC

## 2019-07-25 NOTE — Assessment & Plan Note (Signed)
D/c invokana due to frequent mycotic infections. Will see which GLP 1 is preferred on her formulary and start that. Work on diet and exercise changes, recheck A1C

## 2019-07-26 ENCOUNTER — Other Ambulatory Visit: Payer: Self-pay | Admitting: Family Medicine

## 2019-07-26 MED ORDER — TRULICITY 0.75 MG/0.5ML ~~LOC~~ SOAJ: 0.7500 mg | SUBCUTANEOUS | 3 refills | Status: DC

## 2019-08-12 ENCOUNTER — Other Ambulatory Visit: Payer: Self-pay | Admitting: Family Medicine

## 2019-08-15 ENCOUNTER — Other Ambulatory Visit: Payer: Self-pay | Admitting: Family Medicine

## 2019-08-15 NOTE — Telephone Encounter (Signed)
Requested medication (s) are due for refill today:yes  Requested medication (s) are on the active medication list: yes  Last refill:  05/22/2019  Future visit scheduled:no Notes to clinic: The original prescription was discontinued on 07/21/2019 by Volney American, PA-C. Renewing this prescription may not be appropriate    Requested Prescriptions  Pending Prescriptions Disp Refills   INVOKANA 300 MG TABS tablet [Pharmacy Med Name: INVOKANA 300 MG TABLET] 90 tablet 0    Sig: TAKE 1 TABLET (300 MG TOTAL) BY MOUTH DAILY BEFORE BREAKFAST.     Endocrinology: Diabetes - SGLT2 Inhibitors - canagliflozin Failed - 08/15/2019  1:26 AM      Failed - HBA1C is between 0 and 7.9 and within 180 days    Hemoglobin A1C  Date Value Ref Range Status  10/26/2016 6.9%  Final   HB A1C (BAYER DCA - WAIVED)  Date Value Ref Range Status  08/01/2018 7.1 (H) <7.0 % Final    Comment:                                          Diabetic Adult            <7.0                                       Healthy Adult        4.3 - 5.7                                                           (DCCT/NGSP) American Diabetes Association's Summary of Glycemic Recommendations for Adults with Diabetes: Hemoglobin A1c <7.0%. More stringent glycemic goals (A1c <6.0%) may further reduce complications at the cost of increased risk of hypoglycemia.    Hgb A1c MFr Bld  Date Value Ref Range Status  07/21/2019 8.9 (H) 4.8 - 5.6 % Final    Comment:             Prediabetes: 5.7 - 6.4          Diabetes: >6.4          Glycemic control for adults with diabetes: <7.0          Passed - Cr in normal range and within 360 days    Creatinine  Date Value Ref Range Status  07/22/2013 0.89 0.60 - 1.30 mg/dL Final   Creatinine, Ser  Date Value Ref Range Status  05/23/2019 0.74 0.44 - 1.00 mg/dL Final         Passed - LDL in normal range and within 360 days    LDL Calculated  Date Value Ref Range Status  03/03/2019 88 0 - 99  mg/dL Final         Passed - eGFR in normal range and within 360 days    EGFR (African American)  Date Value Ref Range Status  07/22/2013 >60  Final   GFR calc Af Amer  Date Value Ref Range Status  05/23/2019 >60 >60 mL/min Final   EGFR (Non-African Amer.)  Date Value Ref Range Status  07/22/2013 >60  Final    Comment:  eGFR values <62m/min/1.73 m2 may be an indication of chronic kidney disease (CKD). Calculated eGFR is useful in patients with stable renal function. The eGFR calculation will not be reliable in acutely ill patients when serum creatinine is changing rapidly. It is not useful in  patients on dialysis. The eGFR calculation may not be applicable to patients at the low and high extremes of body sizes, pregnant women, and vegetarians.    GFR calc non Af Amer  Date Value Ref Range Status  05/23/2019 >60 >60 mL/min Final         Passed - Valid encounter within last 6 months    Recent Outpatient Visits          3 weeks ago Uncontrolled type 2 diabetes mellitus with hyperglycemia ([facility]   [facility] [place]  2 months ago Right-sided Bell's palsy   [facility] [place]  5 months ago Uncontrolled type 2 diabetes mellitus with hyperglycemia ([facility]   [facility] [place]  7 months ago Vaginal candidiasis   [facility] [place]  8 months ago Uncontrolled type 2 diabetes mellitus with hyperglycemia ([facility]   [facility] [place]

## 2019-08-15 NOTE — Telephone Encounter (Signed)
Is she still on this medication?

## 2019-08-18 ENCOUNTER — Telehealth: Payer: Self-pay

## 2019-08-18 NOTE — Telephone Encounter (Signed)
Called pt she is still experiencing covid symptoms, scheduled her a virtual appt for Tuesday

## 2019-08-18 NOTE — Telephone Encounter (Signed)
What is the next steps since the patient is still having symptoms?     Copied from [place] [id number]. Topic: General - Other >> Aug 18, 2019  8:45 AM Keene Breath wrote: Reason for CRM: Patient called to request a note for her employer because she is still sick from [place] symptoms.  She does not have fever, but her employer would like a note from the doctor.  Please call to discuss.  CB# [id number] >> Aug 18, 2019  1:57 PM Mathis Bud wrote: Patient is now requesting a call back from PCP or nurse.  Patient was exposed to covid. Patient did test negative from covid as of yesterday, but is still experiencing and throat is swore.  Patient would like to know what the next steps are.  Patient call back # [phone]

## 2019-08-18 NOTE — Telephone Encounter (Signed)
We did not see her or test her for COVID so she will need an appt or a note from wherever she got tested

## 2019-08-20 ENCOUNTER — Other Ambulatory Visit: Payer: Self-pay | Admitting: Family Medicine

## 2019-08-22 ENCOUNTER — Other Ambulatory Visit: Payer: Self-pay

## 2019-08-22 ENCOUNTER — Encounter: Payer: Self-pay | Admitting: Family Medicine

## 2019-08-22 ENCOUNTER — Ambulatory Visit (INDEPENDENT_AMBULATORY_CARE_PROVIDER_SITE_OTHER): Payer: Commercial Managed Care - PPO | Admitting: Family Medicine

## 2019-08-22 VITALS — Temp 98.0°F | Ht 62.0 in | Wt 280.0 lb

## 2019-08-22 DIAGNOSIS — E1165 Type 2 diabetes mellitus with hyperglycemia: Secondary | ICD-10-CM | POA: Diagnosis not present

## 2019-08-22 DIAGNOSIS — R197 Diarrhea, unspecified: Secondary | ICD-10-CM | POA: Diagnosis not present

## 2019-08-22 DIAGNOSIS — R112 Nausea with vomiting, unspecified: Secondary | ICD-10-CM

## 2019-08-22 MED ORDER — GLIPIZIDE 5 MG PO TABS: 5.0000 mg | ORAL_TABLET | Freq: Two times a day (BID) | ORAL | 2 refills | Status: DC

## 2019-08-22 NOTE — Progress Notes (Signed)
Temp 98 F (36.7 C) (Temporal)    Ht 5\' 2"  (1.575 m)    Wt 280 lb (127 kg)    BMI 51.21 kg/m    Subjective:    Patient ID: Eileen Ritter, female    DOB: Feb 27, 1966, 53 y.o.   MRN: [id number]  HPI: Eileen Ritter is a 53 y.o. female  Chief Complaint  Patient presents with   Nausea    COVID test came back negative. She has to get another test Thursday. Patient thinks her symptoms are coming from Trulicity.    Diarrhea   Emesis     This visit was completed via WebEx due to the restrictions of the COVID-19 pandemic. All issues as above were discussed and addressed. Physical exam was done as above through visual confirmation on WebEx. If it was felt that the patient should be evaluated in the office, they were directed there. The patient verbally consented to this visit.  Location of the patient: home  Location of the provider: home  Those involved with this call:   Provider: Merrie Roof, PA-C  CMA: Merilyn Baba, [place] Desk/Registration: Jill Side   Time spent on call: 25 minutes with patient face to face via video conference. More than 50% of this time was spent in counseling and coordination of care. 5 minutes total spent in review of patient's record and preparation of their chart. I verified patient identity using two factors (patient name and date of birth). Patient consents verbally to being seen via telemedicine visit today.   Typically having N/V/D several days after injecting the trulicity each time. This happened to a lesser extent with ozempic but seemed to go away after a while. Was tested for COVID last week due to her GI sxs and that came back negative. Work is requiring she be re-tested this week. Did have an exposure to [place] at work but feels her sxs are more related to the new medicine. 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  Also taking lots of ibuprofen at the moment and icing her knees because they are flared up from the type of work she's been having to do the last few times. Thinks this NSAID use may be contributing to her GI sxs.   Home BSs several hours postprandial will be 130s, morning numbers running around 170. Has not been watching diet or exercising.   Relevant past medical, surgical, family and social history reviewed and updated as indicated. Interim medical history since our last visit reviewed. Allergies and medications reviewed and updated.  Review of Systems  Per HPI unless specifically indicated above     Objective:    Temp 98 F (36.7 C) (Temporal)    Ht 5\' 2"  (1.575 m)    Wt 280 lb (127 kg)    BMI 51.21 kg/m   Wt Readings from Last 3 Encounters:  08/22/19 280 lb (127 kg)  05/24/19 287 lb (130.2 kg)  05/23/19 280 lb (127 kg)    Physical Exam Vitals signs and nursing note reviewed.  Constitutional:      General: She is not in acute distress.    Appearance: Normal appearance.  HENT:     Head: Atraumatic.     Right Ear: External ear normal.     Left Ear: External ear normal.     Nose: Nose normal. No congestion.  Mouth/Throat:     Mouth: Mucous membranes are moist.     Pharynx: Oropharynx is clear. No posterior oropharyngeal erythema.  Eyes:     Extraocular Movements: Extraocular movements intact.     Conjunctiva/sclera: Conjunctivae normal.  Neck:     Musculoskeletal: Normal range of motion.  Cardiovascular:     Comments: Unable to assess via virtual visit Pulmonary:     Effort: Pulmonary effort is normal. No respiratory distress.  Abdominal:     Comments: Abdomen nontender per patient's self exam  Musculoskeletal: Normal range of motion.  Skin:    General: Skin is dry.     Findings: No erythema.  Neurological:     Mental Status: She is alert and oriented to person, place, and time.  Psychiatric:        Mood and Affect: Mood normal.        Thought Content:  Thought content normal.        Judgment: Judgment normal.     Results for orders placed or performed in visit on 07/21/19  WET PREP FOR [place], YEAST, CLUE   Specimen: Vaginal; Sterile Swab   STERILE SWAB  Result Value Ref Range   Trichomonas Exam Negative Negative   Yeast Exam Positive (A) Negative   Clue Cell Exam Positive (A) Negative  HgB A1c  Result Value Ref Range   Hgb A1c MFr Bld 8.9 (H) 4.8 - 5.6 %   Est. average glucose Bld gHb Est-mCnc 209 mg/dL      Assessment & Plan:   Problem List Items Addressed This Visit      Endocrine   Uncontrolled type 2 diabetes mellitus ([place]) - Primary    Poor tolerance to trulicity and ozempic, and had mycotic infections on invokana and could not afford [place]. Will switch to glipizide and continue metformin. WOrk hard on lifestyle. Patient does state she just picked up 1 month supply of trulicity and would like to finish out that month. Discussed eating small portions while on it to help with her nausea sxs. She will message or call if wanting to stay on trulicity instead of go to glipizide after she finishes the month supply.       Relevant Medications   glipiZIDE (GLUCOTROL) 5 MG tablet    Other Visit Diagnoses    Nausea vomiting and diarrhea       Likely side effect to trulicity. COVID neg, to be retested per work protocol this week. Will release back to work and adjust DM regimen as stated. Zofran prn       Follow up plan: Return in about 3 months (around 11/21/2019) for DM.

## 2019-08-24 ENCOUNTER — Other Ambulatory Visit: Payer: Self-pay

## 2019-08-24 DIAGNOSIS — Z20822 Contact with and (suspected) exposure to covid-19: Secondary | ICD-10-CM

## 2019-08-24 NOTE — Assessment & Plan Note (Addendum)
Poor tolerance to trulicity and ozempic, and had mycotic infections on invokana and could not afford [place]. Will switch to glipizide and continue metformin. WOrk hard on lifestyle. Patient does state she just picked up 1 month supply of trulicity and would like to finish out that month. Discussed eating small portions while on it to help with her nausea sxs. She will message or call if wanting to stay on trulicity instead of go to glipizide after she finishes the month supply.

## 2019-08-25 LAB — NOVEL CORONAVIRUS, NAA: SARS-CoV-2, NAA: NOT DETECTED

## 2019-09-04 ENCOUNTER — Other Ambulatory Visit: Payer: Self-pay

## 2019-09-04 ENCOUNTER — Encounter: Payer: Self-pay | Admitting: Family Medicine

## 2019-09-04 ENCOUNTER — Ambulatory Visit (INDEPENDENT_AMBULATORY_CARE_PROVIDER_SITE_OTHER): Payer: Commercial Managed Care - PPO | Admitting: Family Medicine

## 2019-09-04 VITALS — BP 136/81 | HR 74 | Temp 98.6°F | Ht 61.0 in | Wt 294.4 lb

## 2019-09-04 DIAGNOSIS — E1165 Type 2 diabetes mellitus with hyperglycemia: Secondary | ICD-10-CM

## 2019-09-04 DIAGNOSIS — Z0000 Encounter for general adult medical examination without abnormal findings: Secondary | ICD-10-CM

## 2019-09-04 DIAGNOSIS — I10 Essential (primary) hypertension: Secondary | ICD-10-CM

## 2019-09-04 DIAGNOSIS — E782 Mixed hyperlipidemia: Secondary | ICD-10-CM

## 2019-09-04 DIAGNOSIS — F3341 Major depressive disorder, recurrent, in partial remission: Secondary | ICD-10-CM | POA: Diagnosis not present

## 2019-09-04 DIAGNOSIS — N181 Chronic kidney disease, stage 1: Secondary | ICD-10-CM

## 2019-09-04 LAB — UA/M W/RFLX CULTURE, ROUTINE
Bilirubin, UA: NEGATIVE
Glucose, UA: NEGATIVE
Ketones, UA: NEGATIVE
Leukocytes,UA: NEGATIVE
Nitrite, UA: NEGATIVE
Protein,UA: NEGATIVE
RBC, UA: NEGATIVE
Specific Gravity, UA: 1.02 (ref 1.005–1.030)
Urobilinogen, Ur: 1 mg/dL (ref 0.2–1.0)
pH, UA: 7 (ref 5.0–7.5)

## 2019-09-04 NOTE — Assessment & Plan Note (Signed)
Fasting sugars still elevated, will give trulicity 3 more months and recheck things then. May need to increase regimen at that time if still not to goal. Work hard on diet and exercise in interim.

## 2019-09-04 NOTE — Assessment & Plan Note (Signed)
Recheck lipids and adjust as needed. Continue current regimen and good lifestyle modifications

## 2019-09-04 NOTE — Progress Notes (Signed)
BP 136/81 (BP Location: Left Arm, Patient Position: Sitting, Cuff Size: Normal)   Pulse 74   Temp 98.6 F (37 C) (Oral)   Ht 5\' 1"  (1.549 m)   Wt 294 lb 6.4 oz (133.5 kg)   SpO2 97%   BMI 55.63 kg/m    Subjective:    Patient ID: Eileen Ritter, female    DOB: 1966/03/19, 53 y.o.   MRN: [id number]  HPI: [name] is a 53 y.o. female presenting on 09/04/2019 for comprehensive medical examination. Current medical complaints include:see below  Feeling better as far as the GI issues with Trulicity. Still getting 170s in the mornings but better than previous. Did not start glipizide yet, was to switch over but wanted to finish her supply of trulicity she had just picked up and now thinks she wants to stay on it since she's doing a bit better. Under lots of stress, not eating right or exercising. Denies low blood sugar spells.   Stress levels have been very high with work. Has been taking the klonopin 1/2 tab prn which does help. Notes she will have episodes of SOB when things get really bad and the medicine will calm her down. Also still taking the wellbutrin and prozac which she feels are helping. Denies SI/HI.   Allergies under good control with current medicines, left ear doing much better now.   HTN - readings doing well, under 140/90 consistently. Denies CP, SOB, HAs, dizziness.   She currently lives with: Menopausal Symptoms: no  Depression Screen done today and results listed below:  Depression screen [facility] 2/9 03/03/2019 08/25/2018 08/01/2018 05/03/2018 01/28/2018  Decreased Interest 0 0 0 0 0  Down, Depressed, Hopeless 2 0 1 0 0  PHQ - 2 Score 2 0 1 0 0  Altered sleeping 1 1 0 0 0  Tired, decreased energy 1 1 0 0 0  Change in appetite 1 0 0 1 2  Feeling bad or failure about yourself  1 0 0 0 0  Trouble concentrating 0 0 0 0 1  Moving slowly or fidgety/restless 0 0 0 0 0  Suicidal thoughts 0 0 0 0 0  PHQ-9 Score 6 2 1 1 3     The patient does not have a  history of falls. I did not complete a risk assessment for falls. A plan of care for falls was not documented.   Past Medical History:  Past Medical History:  Diagnosis Date  . Allergic rhinitis   . Anxiety   . Chronic kidney disease   . Depression   . Diabetes ([place])   . GERD (gastroesophageal reflux disease)   . HBP (high blood pressure)   . High cholesterol   . Iron deficiency anemia due to chronic blood loss 01/13/2018  . Obesity     Surgical History:  Past Surgical History:  Procedure Laterality Date  . APPENDECTOMY    . COLONOSCOPY WITH PROPOFOL N/A 09/23/2017   Procedure: COLONOSCOPY WITH PROPOFOL;  Surgeon: Jonathon Bellows, MD;  Location: [facility] ENDOSCOPY;  Service: Gastroenterology;  Laterality: N/A;  . CYST EXCISION     back  . ESOPHAGOGASTRODUODENOSCOPY (EGD) WITH PROPOFOL N/A 09/23/2017   Procedure: ESOPHAGOGASTRODUODENOSCOPY (EGD) WITH PROPOFOL;  Surgeon: Jonathon Bellows, MD;  Location: [facility] ENDOSCOPY;  Service: Gastroenterology;  Laterality: N/A;  . ESOPHAGOGASTRODUODENOSCOPY (EGD) WITH PROPOFOL N/A 06/13/2018   Procedure: ESOPHAGOGASTRODUODENOSCOPY (EGD) WITH PROPOFOL;  Surgeon: Jonathon Bellows, MD;  Location: [facility] ENDOSCOPY;  Service: Gastroenterology;  Laterality: N/A;  . GIVENS CAPSULE  STUDY N/A 07/20/2018   Procedure: GIVENS CAPSULE STUDY;  Surgeon: Jonathon Bellows, MD;  Location: [facility] ENDOSCOPY;  Service: Gastroenterology;  Laterality: N/A;  . TONSILLECTOMY    . TUBAL LIGATION      Medications:  Current Outpatient Medications on File Prior to Visit  Medication Sig  . Ascorbic Acid (VITAMIN C) 100 MG tablet Take 100 mg by mouth daily.  Marland Kitchen atorvastatin (LIPITOR) 40 MG tablet TAKE 1 TABLET BY MOUTH EVERY DAY  . buPROPion (WELLBUTRIN SR) 150 MG 12 hr tablet TAKE 1 TABLET BY MOUTH EVERY DAY  . Cetirizine HCl (ZYRTEC ALLERGY PO) Take by mouth daily. 10 mg  . Cholecalciferol (VITAMIN D) 2000 units tablet Take 2,000 Units by mouth daily.  . clonazePAM (KLONOPIN) 0.5 MG tablet Take 1  tablet (0.5 mg total) by mouth daily as needed for anxiety.  . cyclobenzaprine (FLEXERIL) 10 MG tablet Take 1 tablet (10 mg total) by mouth as needed for muscle spasms.  . Ferrous Sulfate Dried (SLOW RELEASE IRON) 45 MG TBCR Take 45 mg by mouth daily.  . Fluoxetine HCl, PMDD, 20 MG TABS TAKE 1 TABLET BY MOUTH EVERYDAY AT BEDTIME  . fluticasone (FLONASE) 50 MCG/ACT nasal spray Place 2 sprays into both nostrils daily.  Marland Kitchen glucose blood test strip Use as instructed  . losartan (COZAAR) 50 MG tablet TAKE 1 TABLET BY MOUTH EVERY DAY  . metFORMIN (GLUCOPHAGE) 500 MG tablet TAKE 2 TABLETS BY MOUTH TWICE A DAY WITH A MEAL  . montelukast (SINGULAIR) 10 MG tablet TAKE 1 TABLET BY MOUTH EVERYDAY AT BEDTIME  . omeprazole (PRILOSEC) 20 MG capsule TAKE 1 CAPSULE BY MOUTH EVERY DAY  . ondansetron (ZOFRAN) 4 MG tablet Take 1 tablet (4 mg total) by mouth every 8 (eight) hours as needed for nausea or vomiting.  . vitamin B-12 (CYANOCOBALAMIN) 100 MCG tablet Take 100 mcg by mouth daily.  Marland Kitchen glipiZIDE (GLUCOTROL) 5 MG tablet Take 1 tablet (5 mg total) by mouth 2 (two) times daily before a meal. (Patient not taking: Reported on 09/04/2019)  . Probiotic Product (PROBIOTIC PO) Take 1 tablet by mouth daily.   No current facility-administered medications on file prior to visit.     Allergies:  No Known Allergies  Social History:  Social History   Socioeconomic History  . Marital status: Single    Spouse name: Not on file  . Number of children: Not on file  . Years of education: Not on file  . Highest education level: Not on file  Occupational History  . Not on file  Social Needs  . Financial resource strain: Not on file  . Food insecurity    Worry: Not on file    Inability: Not on file  . Transportation needs    Medical: Not on file    Non-medical: Not on file  Tobacco Use  . Smoking status: Former Smoker    Quit date: 11/01/2013    Years since quitting: 5.8  . Smokeless tobacco: Never Used   Substance and Sexual Activity  . Alcohol use: No  . Drug use: No  . Sexual activity: Yes  Lifestyle  . Physical activity    Days per week: Not on file    Minutes per session: Not on file  . Stress: Not on file  Relationships  . Social Herbalist on phone: Not on file    Gets together: Not on file    Attends religious service: Not on file    Active member  of club or organization: Not on file    Attends meetings of clubs or organizations: Not on file    Relationship status: Not on file  . Intimate partner violence    Fear of current or ex partner: Not on file    Emotionally abused: Not on file    Physically abused: Not on file    Forced sexual activity: Not on file  Other Topics Concern  . Not on file  Social History Narrative  . Not on file   Social History   Tobacco Use  Smoking Status Former Smoker  . Quit date: 11/01/2013  . Years since quitting: 5.8  Smokeless Tobacco Never Used   Social History   Substance and Sexual Activity  Alcohol Use No    Family History:  Family History  Problem Relation Age of Onset  . Diabetes Mother   . Heart disease Mother   . Hypertension Mother   . Stroke Mother   . Cancer Mother        LUNG  . Mental illness Mother   . Thyroid disease Mother   . Diabetes Father   . Heart disease Father   . Diabetes Sister   . Diabetes Brother   . Heart disease Brother   . Heart disease Maternal Grandmother   . Breast cancer Neg Hx     Past medical history, surgical history, medications, allergies, family history and social history reviewed with patient today and changes made to appropriate areas of the chart.   Review of Systems - General ROS: negative Psychological ROS: positive for - anxiety Ophthalmic ROS: negative ENT ROS: negative Allergy and Immunology ROS: negative Hematological and Lymphatic ROS: negative Endocrine ROS: negative Breast ROS: negative for breast lumps Respiratory ROS: no cough, shortness of  breath, or wheezing Cardiovascular ROS: no chest pain or dyspnea on exertion Gastrointestinal ROS: no abdominal pain, change in bowel habits, or black or bloody stools Genito-Urinary ROS: no dysuria, trouble voiding, or hematuria Musculoskeletal ROS: negative Neurological ROS: no TIA or stroke symptoms Dermatological ROS: negative All other ROS negative except what is listed above and in the HPI.      Objective:    BP 136/81 (BP Location: Left Arm, Patient Position: Sitting, Cuff Size: Normal)   Pulse 74   Temp 98.6 F (37 C) (Oral)   Ht 5\' 1"  (1.549 m)   Wt 294 lb 6.4 oz (133.5 kg)   SpO2 97%   BMI 55.63 kg/m   Wt Readings from Last 3 Encounters:  09/04/19 294 lb 6.4 oz (133.5 kg)  08/22/19 280 lb (127 kg)  05/24/19 287 lb (130.2 kg)    Physical Exam Vitals signs and nursing note reviewed.  Constitutional:      General: She is not in acute distress.    Appearance: She is well-developed. She is obese.  HENT:     Head: Atraumatic.     Right Ear: External ear normal.     Left Ear: External ear normal.     Nose: Nose normal.     Mouth/Throat:     Pharynx: No oropharyngeal exudate.  Eyes:     General: No scleral icterus.    Conjunctiva/sclera: Conjunctivae normal.     Pupils: Pupils are equal, round, and reactive to light.  Neck:     Musculoskeletal: Normal range of motion and neck supple.     Thyroid: No thyromegaly.  Cardiovascular:     Rate and Rhythm: Normal rate and regular rhythm.  Heart sounds: Normal heart sounds.  Pulmonary:     Effort: Pulmonary effort is normal. No respiratory distress.     Breath sounds: Normal breath sounds.  Chest:     Breasts:        Right: No mass, skin change or tenderness.        Left: No mass, skin change or tenderness.  Abdominal:     General: Bowel sounds are normal.     Palpations: Abdomen is soft. There is no mass.     Tenderness: There is no abdominal tenderness.  Genitourinary:    Comments: GU exam declined  Musculoskeletal: Normal range of motion.        General: No tenderness.  Lymphadenopathy:     Cervical: No cervical adenopathy.     Upper Body:     Right upper body: No axillary adenopathy.     Left upper body: No axillary adenopathy.  Skin:    General: Skin is warm and dry.     Findings: No rash.  Neurological:     Mental Status: She is alert and oriented to person, place, and time.     Cranial Nerves: No cranial nerve deficit.  Psychiatric:        Behavior: Behavior normal.     Results for orders placed or performed in visit on 08/24/19  Novel Coronavirus, NAA ([facility])   Specimen: Oropharyngeal(OP) collection in vial transport medium   OROPHARYNGEA  TESTING  Result Value Ref Range   SARS-CoV-2, NAA Not Detected Not Detected      Assessment & Plan:   Problem List Items Addressed This Visit      Cardiovascular and Mediastinum   HTN (hypertension)     Endocrine   Uncontrolled type 2 diabetes mellitus ([place])    Fasting sugars still elevated, will give trulicity 3 more months and recheck things then. May need to increase regimen at that time if still not to goal. Work hard on diet and exercise in interim.       Relevant Orders   HgB A1c     Genitourinary   Chronic kidney disease     Other   Hyperlipidemia    Recheck lipids and adjust as needed. Continue current regimen and good lifestyle modifications      Depression    Stable and under fairly good control, and now some job changes should help with her stressors as well. Continue current regimen       Other Visit Diagnoses    Annual physical exam    -  Primary   Relevant Orders   CBC with Differential/Platelet   Comprehensive metabolic panel   Lipid Panel w/o Chol/HDL Ratio   UA/M w/rflx Culture, Routine       Follow up plan: Return in about 3 months (around 12/04/2019) for DM.   LABORATORY TESTING:  - Pap smear: up to date  IMMUNIZATIONS:   - Tdap: Tetanus vaccination status reviewed: last  tetanus booster within 10 years. - Influenza: receives through work - Pneumovax: Up to date  SCREENING: -Mammogram: Up to date  - Colonoscopy: Up to date   PATIENT COUNSELING:   Advised to take 1 mg of folate supplement per day if capable of pregnancy.   Sexuality: Discussed sexually transmitted diseases, partner selection, use of condoms, avoidance of unintended pregnancy  and contraceptive alternatives.   Advised to avoid cigarette smoking.  I discussed with the patient that most people either abstain from alcohol or drink within safe limits (<=14/week and <=4  drinks/occasion for males, <=7/weeks and <= 3 drinks/occasion for females) and that the risk for alcohol disorders and other health effects rises proportionally with the number of drinks per week and how often a drinker exceeds daily limits.  Discussed cessation/primary prevention of drug use and availability of treatment for abuse.   Diet: Encouraged to adjust caloric intake to maintain  or achieve ideal body weight, to reduce intake of dietary saturated fat and total fat, to limit sodium intake by avoiding high sodium foods and not adding table salt, and to maintain adequate dietary potassium and calcium preferably from fresh fruits, vegetables, and low-fat dairy products.    stressed the importance of regular exercise  Injury prevention: Discussed safety belts, safety helmets, smoke detector, smoking near bedding or upholstery.   Dental health: Discussed importance of regular tooth brushing, flossing, and dental visits.    NEXT PREVENTATIVE PHYSICAL DUE IN 1 YEAR. Return in about 3 months (around 12/04/2019) for DM.

## 2019-09-04 NOTE — Assessment & Plan Note (Signed)
Stable and under fairly good control, and now some job changes should help with her stressors as well. Continue current regimen

## 2019-09-05 LAB — CBC WITH DIFFERENTIAL/PLATELET
Basophils Absolute: 0.1 10*3/uL (ref 0.0–0.2)
Basos: 1 %
EOS (ABSOLUTE): 0.2 10*3/uL (ref 0.0–0.4)
Eos: 2 %
Hematocrit: 41.6 % (ref 34.0–46.6)
Hemoglobin: 13.9 g/dL (ref 11.1–15.9)
Immature Grans (Abs): 0.1 10*3/uL (ref 0.0–0.1)
Immature Granulocytes: 1 %
Lymphocytes Absolute: 3.2 10*3/uL — ABNORMAL HIGH (ref 0.7–3.1)
Lymphs: 32 %
MCH: 27.7 pg (ref 26.6–33.0)
MCHC: 33.4 g/dL (ref 31.5–35.7)
MCV: 83 fL (ref 79–97)
Monocytes Absolute: 0.9 10*3/uL (ref 0.1–0.9)
Monocytes: 9 %
Neutrophils Absolute: 5.4 10*3/uL (ref 1.4–7.0)
Neutrophils: 55 %
Platelets: 306 10*3/uL (ref 150–450)
RBC: 5.02 x10E6/uL (ref 3.77–5.28)
RDW: 14.3 % (ref 11.7–15.4)
WBC: 10 10*3/uL (ref 3.4–10.8)

## 2019-09-05 LAB — COMPREHENSIVE METABOLIC PANEL
ALT: 29 IU/L (ref 0–32)
AST: 20 IU/L (ref 0–40)
Albumin/Globulin Ratio: 2 (ref 1.2–2.2)
Albumin: 4.2 g/dL (ref 3.8–4.9)
Alkaline Phosphatase: 86 IU/L (ref 39–117)
BUN/Creatinine Ratio: 15 (ref 9–23)
BUN: 11 mg/dL (ref 6–24)
Bilirubin Total: 0.3 mg/dL (ref 0.0–1.2)
CO2: 24 mmol/L (ref 20–29)
Calcium: 9.3 mg/dL (ref 8.7–10.2)
Chloride: 99 mmol/L (ref 96–106)
Creatinine, Ser: 0.72 mg/dL (ref 0.57–1.00)
GFR calc Af Amer: 112 mL/min/{1.73_m2} (ref >59)
GFR calc non Af Amer: 97 mL/min/{1.73_m2} (ref >59)
Globulin, Total: 2.1 g/dL (ref 1.5–4.5)
Glucose: 170 mg/dL — ABNORMAL HIGH (ref 65–99)
Potassium: 4.8 mmol/L (ref 3.5–5.2)
Sodium: 138 mmol/L (ref 134–144)
Total Protein: 6.3 g/dL (ref 6.0–8.5)

## 2019-09-05 LAB — LIPID PANEL W/O CHOL/HDL RATIO
Cholesterol, Total: 139 mg/dL (ref 100–199)
HDL: 42 mg/dL
LDL Chol Calc (NIH): 79 mg/dL (ref 0–99)
Triglycerides: 93 mg/dL (ref 0–149)
VLDL Cholesterol Cal: 18 mg/dL (ref 5–40)

## 2019-09-05 LAB — HEMOGLOBIN A1C
Est. average glucose Bld gHb Est-mCnc: 194 mg/dL
Hgb A1c MFr Bld: 8.4 % — ABNORMAL HIGH (ref 4.8–5.6)

## 2019-09-06 ENCOUNTER — Other Ambulatory Visit: Payer: Self-pay | Admitting: Unknown Physician Specialty

## 2019-09-06 NOTE — Telephone Encounter (Signed)
Requested medication (s) are due for refill today: yes  Requested medication (s) are on the active medication list: yes  Last refill:  06/07/2019  Future visit scheduled: yes  Notes to clinic:  Review for refill   Requested Prescriptions  Pending Prescriptions Disp Refills   buPROPion (WELLBUTRIN SR) 150 MG 12 hr tablet [Pharmacy Med Name: BUPROPION HCL SR 150 MG TABLET] 90 tablet 0    Sig: TAKE 1 [place]     Psychiatry: Antidepressants - bupropion Passed - 09/06/2019  1:21 AM      Passed - Last BP in normal range    BP Readings from Last 1 Encounters:  09/04/19 136/81         Passed - Valid encounter within last 6 months    Recent Outpatient Visits          2 days ago Annual physical exam   [facility] Volney American, PA-C   2 weeks ago Uncontrolled type 2 diabetes mellitus with hyperglycemia [facility])   [facility], [place], [place]   1 month ago Uncontrolled type 2 diabetes mellitus with hyperglycemia [facility])   [facility] Volney American, [place]   3 months ago Right-sided Bell's palsy   [facility] Merrie Roof [place], [place]   6 months ago Uncontrolled type 2 diabetes mellitus with hyperglycemia [facility])   [facility] Volney American, [place]      Future Appointments            In 2 months Orene Desanctis, Lilia Argue, [place], [place] - Completed PHQ-2 or PHQ-9 in the last 360 days.

## 2019-10-06 ENCOUNTER — Other Ambulatory Visit: Payer: Self-pay

## 2019-10-06 DIAGNOSIS — Z20822 Contact with and (suspected) exposure to covid-19: Secondary | ICD-10-CM

## 2019-10-08 LAB — NOVEL CORONAVIRUS, NAA: SARS-CoV-2, NAA: NOT DETECTED

## 2019-10-12 ENCOUNTER — Other Ambulatory Visit: Payer: Self-pay

## 2019-10-12 ENCOUNTER — Encounter: Payer: Self-pay | Admitting: Family Medicine

## 2019-10-12 ENCOUNTER — Ambulatory Visit (INDEPENDENT_AMBULATORY_CARE_PROVIDER_SITE_OTHER): Payer: Commercial Managed Care - PPO | Admitting: Family Medicine

## 2019-10-12 VITALS — Temp 98.0°F | Wt 291.0 lb

## 2019-10-12 DIAGNOSIS — G63 Polyneuropathy in diseases classified elsewhere: Secondary | ICD-10-CM

## 2019-10-12 DIAGNOSIS — M2550 Pain in unspecified joint: Secondary | ICD-10-CM

## 2019-10-12 MED ORDER — GABAPENTIN 300 MG PO CAPS: 300.0000 mg | ORAL_CAPSULE | Freq: Three times a day (TID) | ORAL | 1 refills | Status: DC | PRN

## 2019-10-12 MED ORDER — DICLOFENAC SODIUM 1 % TD GEL: 2.0000 g | Freq: Four times a day (QID) | TRANSDERMAL | 1 refills | Status: DC

## 2019-10-12 NOTE — Progress Notes (Signed)
Temp 98 F (36.7 C) (Temporal)    Wt 291 lb (132 kg)    BMI 54.98 kg/m    Subjective:    Patient ID: Eileen Ritter, female    DOB: 11/07/1966, 53 y.o.   MRN: [id number]  HPI: Eileen Ritter is a 53 y.o. female  Chief Complaint  Patient presents with   Hand Pain    bilateral, tingling x about a month   Foot Pain    bilateral     This visit was completed via WebEx due to the restrictions of the COVID-19 pandemic. All issues as above were discussed and addressed. Physical exam was done as above through visual confirmation on WebEx. If it was felt that the patient should be evaluated in the office, they were directed there. The patient verbally consented to this visit.  Location of the patient: home  Location of the provider: work  Those involved with this call:   Provider: Merrie Roof, PA-C  CMA: Tiffany Reel, CMA  Front Desk/Registration: Jill Side   Time spent on call: 25 minutes with patient face to face via video conference. More than 50% of this time was spent in counseling and coordination of care. 5 minutes total spent in review of patient's record and preparation of their chart. I verified patient identity using two factors (patient name and date of birth). Patient consents verbally to being seen via telemedicine visit today.   Significant pain in b/l hands and feet with numbness and tingling. Also some issues with shoulders and knees. Hands are swelling quite a bit at times. This seemed to correlate with a change in her work position where she's more active at work about a month ago. Taking lots of ibuprofen which again is upsetting her stomach (had recent bout of gastritis due to NSAID use). Denies joint redness, heat, rashes, fevers, new injuries. Does have known neuropathy in hands and feet but states this has never really bothered her this much. Her BSs have been significantly elevated for quite some time now, working on gaining better control  of that.   Relevant past medical, surgical, family and social history reviewed and updated as indicated. Interim medical history since our last visit reviewed. Allergies and medications reviewed and updated.  Review of Systems  Per HPI unless specifically indicated above     Objective:    Temp 98 F (36.7 C) (Temporal)    Wt 291 lb (132 kg)    BMI 54.98 kg/m   Wt Readings from Last 3 Encounters:  10/12/19 291 lb (132 kg)  09/04/19 294 lb 6.4 oz (133.5 kg)  08/22/19 280 lb (127 kg)    Physical Exam Vitals signs and nursing note reviewed.  Constitutional:      General: She is not in acute distress.    Appearance: Normal appearance.  HENT:     Head: Atraumatic.     Right Ear: External ear normal.     Left Ear: External ear normal.     Nose: Nose normal. No congestion.     Mouth/Throat:     Mouth: Mucous membranes are moist.     Pharynx: Oropharynx is clear. No posterior oropharyngeal erythema.  Eyes:     Extraocular Movements: Extraocular movements intact.     Conjunctiva/sclera: Conjunctivae normal.  Neck:     Musculoskeletal: Normal range of motion.  Cardiovascular:     Comments: Unable to assess via virtual visit Pulmonary:     Effort: Pulmonary effort is normal. No respiratory  distress.  Musculoskeletal: Normal range of motion.        General: No swelling or deformity.  Skin:    General: Skin is dry.     Findings: No erythema or rash.  Neurological:     Mental Status: She is alert and oriented to person, place, and time.     Gait: Gait normal.  Psychiatric:        Mood and Affect: Mood normal.        Thought Content: Thought content normal.        Judgment: Judgment normal.     Results for orders placed or performed in visit on 10/12/19  Lyme Ab/Western Blot Reflex  Result Value Ref Range   Lyme IgG/IgM Ab <0.91 0.00 - 0.90 ISR   LYME DISEASE AB, QUANT, IGM <0.80 0.00 - 0.79 index  Rocky mtn spotted fvr abs pnl(IgG+IgM)  Result Value Ref Range   RMSF  IgG WILL FOLLOW    RMSF, IGG, IFA WILL FOLLOW    RMSF IgM WILL FOLLOW   Vitamin B12  Result Value Ref Range   Vitamin B-12 >2000 (H) 232 - 1245 pg/mL  Vitamin D (25 hydroxy)  Result Value Ref Range   Vit D, 25-Hydroxy 51.2 30.0 - 100.0 ng/mL  Sed Rate (ESR)  Result Value Ref Range   Sed Rate 3 0 - 40 mm/hr  Rheumatoid Factor  Result Value Ref Range   Rhuematoid fact SerPl-aCnc <10.0 0.0 - 13.9 IU/mL  ANA w/Reflex  Result Value Ref Range   Anti Nuclear Antibody (ANA) Negative Negative  Uric acid  Result Value Ref Range   Uric Acid 4.6 2.5 - 7.1 mg/dL      Assessment & Plan:   Problem List Items Addressed This Visit      Nervous and Auditory   Peripheral neuropathy    Suspect neuropathy component to her current pain, likely exacerbated due to worsening diabetes control. Will start gabapentin, work on weight loss and BS control. Continue to monitor for benefit      Relevant Medications   gabapentin (NEURONTIN) 300 MG capsule    Other Visit Diagnoses    Arthralgia, unspecified joint    -  Primary   With known OA, suspect neuropathy and OA. Weight loss, improve physical conditioning, diclofenac gel prn. Avoid NSAIDs due to gastritis. Await labs for r/o   Relevant Orders   Uric acid (Completed)   ANA w/Reflex (Completed)   Rheumatoid Factor (Completed)   Sed Rate (ESR) (Completed)   Vitamin D (25 hydroxy) (Completed)   Vitamin B12 (Completed)   Rocky mtn spotted fvr abs pnl(IgG+IgM) (Completed)   Lyme Ab/Western Blot Reflex (Completed)       Follow up plan: Return for as scheduled.

## 2019-10-16 NOTE — Assessment & Plan Note (Signed)
Suspect neuropathy component to her current pain, likely exacerbated due to worsening diabetes control. Will start gabapentin, work on weight loss and BS control. Continue to monitor for benefit

## 2019-10-17 LAB — VITAMIN B12: Vitamin B-12: >2000 pg/mL — ABNORMAL HIGH (ref 232–1245)

## 2019-10-17 LAB — URIC ACID: Uric Acid: 4.6 mg/dL (ref 2.5–7.1)

## 2019-10-17 LAB — ANA W/REFLEX: Anti Nuclear Antibody (ANA): NEGATIVE

## 2019-10-17 LAB — RHEUMATOID FACTOR: Rheumatoid fact SerPl-aCnc: <10 IU/mL (ref 0.0–13.9)

## 2019-10-17 LAB — VITAMIN D 25 HYDROXY (VIT D DEFICIENCY, FRACTURES): Vit D, 25-Hydroxy: 51.2 ng/mL (ref 30.0–100.0)

## 2019-10-17 LAB — RMSF, IGG, IFA: RMSF, IGG, IFA: <1:64 {titer}

## 2019-10-17 LAB — SEDIMENTATION RATE: Sed Rate: 3 mm/hr (ref 0–40)

## 2019-10-17 LAB — LYME AB/WESTERN BLOT REFLEX
LYME DISEASE AB, QUANT, IGM: <0.8 index (ref 0.00–0.79)
Lyme IgG/IgM Ab: <0.91 {ISR} (ref 0.00–0.90)

## 2019-10-17 LAB — ROCKY MTN SPOTTED FVR ABS PNL(IGG+IGM)
RMSF IgG: POSITIVE — AB
RMSF IgM: 0.19 index (ref 0.00–0.89)

## 2019-10-27 ENCOUNTER — Other Ambulatory Visit: Payer: Self-pay

## 2019-10-30 ENCOUNTER — Inpatient Hospital Stay: Payer: Commercial Managed Care - PPO | Attending: Oncology

## 2019-10-30 ENCOUNTER — Other Ambulatory Visit: Payer: Self-pay

## 2019-10-30 DIAGNOSIS — D500 Iron deficiency anemia secondary to blood loss (chronic): Secondary | ICD-10-CM

## 2019-10-30 DIAGNOSIS — C7A010 Malignant carcinoid tumor of the duodenum: Secondary | ICD-10-CM | POA: Insufficient documentation

## 2019-10-30 DIAGNOSIS — C7A8 Other malignant neuroendocrine tumors: Secondary | ICD-10-CM

## 2019-10-30 DIAGNOSIS — D509 Iron deficiency anemia, unspecified: Secondary | ICD-10-CM | POA: Diagnosis not present

## 2019-10-30 LAB — CBC WITH DIFFERENTIAL/PLATELET
Abs Immature Granulocytes: 0.04 10*3/uL (ref 0.00–0.07)
Basophils Absolute: 0.1 10*3/uL (ref 0.0–0.1)
Basophils Relative: 1 %
Eosinophils Absolute: 0.3 10*3/uL (ref 0.0–0.5)
Eosinophils Relative: 3 %
HCT: 40.2 % (ref 36.0–46.0)
Hemoglobin: 13.2 g/dL (ref 12.0–15.0)
Immature Granulocytes: 0 %
Lymphocytes Relative: 37 %
Lymphs Abs: 3.6 10*3/uL (ref 0.7–4.0)
MCH: 26.8 pg (ref 26.0–34.0)
MCHC: 32.8 g/dL (ref 30.0–36.0)
MCV: 81.7 fL (ref 80.0–100.0)
Monocytes Absolute: 0.8 10*3/uL (ref 0.1–1.0)
Monocytes Relative: 9 %
Neutro Abs: 5 10*3/uL (ref 1.7–7.7)
Neutrophils Relative %: 50 %
Platelets: 316 10*3/uL (ref 150–400)
RBC: 4.92 MIL/uL (ref 3.87–5.11)
RDW: 13.1 % (ref 11.5–15.5)
WBC: 9.9 10*3/uL (ref 4.0–10.5)
nRBC: 0 % (ref 0.0–0.2)

## 2019-10-30 LAB — FERRITIN: Ferritin: 35 ng/mL (ref 11–307)

## 2019-10-30 LAB — IRON AND TIBC
Iron: 80 ug/dL (ref 28–170)
Saturation Ratios: 22 % (ref 10.4–31.8)
TIBC: 361 ug/dL (ref 250–450)
UIBC: 281 ug/dL

## 2019-11-01 LAB — CHROMOGRANIN A: Chromogranin A (ng/mL): 77.5 ng/mL (ref 0.0–101.8)

## 2019-11-06 ENCOUNTER — Ambulatory Visit: Payer: Commercial Managed Care - PPO | Admitting: Oncology

## 2019-11-13 ENCOUNTER — Inpatient Hospital Stay: Payer: Commercial Managed Care - PPO | Admitting: Oncology

## 2019-11-14 ENCOUNTER — Other Ambulatory Visit: Payer: Self-pay | Admitting: Family Medicine

## 2019-11-14 ENCOUNTER — Other Ambulatory Visit: Payer: Self-pay

## 2019-11-14 ENCOUNTER — Encounter: Payer: Self-pay | Admitting: Oncology

## 2019-11-14 NOTE — Progress Notes (Signed)
Patient prescreened for telehealth visit. No concerns voiced.

## 2019-11-15 ENCOUNTER — Inpatient Hospital Stay: Payer: Commercial Managed Care - PPO | Attending: Oncology | Admitting: Oncology

## 2019-11-15 DIAGNOSIS — D509 Iron deficiency anemia, unspecified: Secondary | ICD-10-CM | POA: Insufficient documentation

## 2019-11-15 DIAGNOSIS — C7A8 Other malignant neuroendocrine tumors: Secondary | ICD-10-CM | POA: Diagnosis not present

## 2019-11-15 DIAGNOSIS — Z862 Personal history of diseases of the blood and blood-forming organs and certain disorders involving the immune mechanism: Secondary | ICD-10-CM | POA: Diagnosis not present

## 2019-11-15 DIAGNOSIS — Z85060 Personal history of malignant carcinoid tumor of small intestine: Secondary | ICD-10-CM | POA: Insufficient documentation

## 2019-11-15 NOTE — Telephone Encounter (Signed)
Requested Prescriptions  Pending Prescriptions Disp Refills  . TRULICITY [id number] SOPN [Pharmacy Med Name: TRULICITY [id number] PEN]  3    Sig: INJECT 0.75 MG INTO THE SKIN ONCE A WEEK.     Endocrinology:  Diabetes - GLP-1 Receptor Agonists Failed - 11/14/2019  7:13 PM      Failed - HBA1C is between 0 and 7.9 and within 180 days    Hemoglobin A1C  Date Value Ref Range Status  10/26/2016 6.9%  Final   HB A1C (BAYER DCA - WAIVED)  Date Value Ref Range Status  08/01/2018 7.1 (H) <7.0 % Final    Comment:                                          Diabetic Adult            <7.0                                       Healthy Adult        4.3 - 5.7                                                           (DCCT/NGSP) American Diabetes Association's Summary of Glycemic Recommendations for Adults with Diabetes: Hemoglobin A1c <7.0%. More stringent glycemic goals (A1c <6.0%) may further reduce complications at the cost of increased risk of hypoglycemia.    Hgb A1c MFr Bld  Date Value Ref Range Status  09/04/2019 8.4 (H) 4.8 - 5.6 % Final    Comment:             Prediabetes: 5.7 - 6.4          Diabetes: >6.4          Glycemic control for adults with diabetes: <7.0          Passed - Valid encounter within last 6 months    Recent Outpatient Visits          1 month ago Arthralgia, unspecified joint   [facility] Volney American, [place]   2 months ago Annual physical exam   [facility] Volney American, [place]   2 months ago Uncontrolled type 2 diabetes mellitus with hyperglycemia [facility])   [facility] Merrie Roof [place], [place]   3 months ago Uncontrolled type 2 diabetes mellitus with hyperglycemia [facility])   [place], [place], [place]   5 months ago Right-sided Bell's palsy   [place], Lilia Argue, [place]      Future Appointments            In 2 weeks Orene Desanctis, Lilia Argue,  [place], [place]           . omeprazole (PRILOSEC) 20 MG capsule [Pharmacy Med Name: OMEPRAZOLE DR 20 MG CAPSULE] 90 capsule 1    Sig: TAKE 1 CAPSULE BY MOUTH EVERY DAY     Gastroenterology: Proton Pump Inhibitors Passed - 11/14/2019  7:13 PM      Passed - Valid encounter within last 12 months    Recent  Outpatient Visits          1 month ago Arthralgia, unspecified joint   [place], [place]   2 months ago Annual physical exam   [facility] Merrie Roof [place], [place]   2 months ago Uncontrolled type 2 diabetes mellitus with hyperglycemia [facility])   [facility] Merrie Roof [place], [place]   3 months ago Uncontrolled type 2 diabetes mellitus with hyperglycemia [facility])   [place], [place], [place]   5 months ago Right-sided Bell's palsy   [place], Lilia Argue, [place]      Future Appointments            In 2 weeks Orene Desanctis, Lilia Argue, [place], [place]

## 2019-11-15 NOTE — Telephone Encounter (Signed)
Routing to provider  

## 2019-11-15 NOTE — Telephone Encounter (Signed)
Requested medication (s) are due for refill today: yes  Requested medication (s) are on the active medication list: yes  Last refill: 10/10/2019  Future visit scheduled: yes  Notes to clinic:  Historical provider    Requested Prescriptions  Pending Prescriptions Disp Refills   TRULICITY [id number] SOPN [Pharmacy Med Name: TRULICITY [id number] ML PEN]  3    Sig: INJECT 0.75 MG INTO THE SKIN ONCE A WEEK.     Endocrinology:  Diabetes - GLP-1 Receptor Agonists Failed - 11/14/2019  7:13 PM      Failed - HBA1C is between 0 and 7.9 and within 180 days    Hemoglobin A1C  Date Value Ref Range Status  10/26/2016 6.9%  Final   HB A1C (BAYER DCA - WAIVED)  Date Value Ref Range Status  08/01/2018 7.1 (H) <7.0 % Final    Comment:                                          Diabetic Adult            <7.0                                       Healthy Adult        4.3 - 5.7                                                           (DCCT/NGSP) American Diabetes Association's Summary of Glycemic Recommendations for Adults with Diabetes: Hemoglobin A1c <7.0%. More stringent glycemic goals (A1c <6.0%) may further reduce complications at the cost of increased risk of hypoglycemia.    Hgb A1c MFr Bld  Date Value Ref Range Status  09/04/2019 8.4 (H) 4.8 - 5.6 % Final    Comment:             Prediabetes: 5.7 - 6.4          Diabetes: >6.4          Glycemic control for adults with diabetes: <7.0          Passed - Valid encounter within last 6 months    Recent Outpatient Visits          1 month ago Arthralgia, unspecified joint   [facility], [place]   2 months ago Annual physical exam   [facility], [place]   2 months ago Uncontrolled type 2 diabetes mellitus with hyperglycemia [facility])   [facility], [place], [place]   3 months ago Uncontrolled type 2 diabetes mellitus with hyperglycemia [facility])   [place], [place], [place]   5 months ago Right-sided Bell's palsy   [place], Lilia Argue, [place]      Future Appointments            In 2 weeks Orene Desanctis, Lilia Argue, [place], [facility]           Signed Prescriptions Disp Refills   omeprazole (PRILOSEC) 20 MG capsule 90 capsule 1    Sig: TAKE 1 CAPSULE  BY MOUTH EVERY DAY     Gastroenterology: Proton Pump Inhibitors Passed - 11/14/2019  7:13 PM      Passed - Valid encounter within last 12 months    Recent Outpatient Visits          1 month ago Arthralgia, unspecified joint   [place], [place]   2 months ago Annual physical exam   [facility], [place]   2 months ago Uncontrolled type 2 diabetes mellitus with hyperglycemia [facility])   [facility] Merrie Roof [place], [place]   3 months ago Uncontrolled type 2 diabetes mellitus with hyperglycemia [facility])   [place], [place], [place]   5 months ago Right-sided Bell's palsy   [place], Lilia Argue, [place]      Future Appointments            In 2 weeks Orene Desanctis, Lilia Argue, [place], [place]

## 2019-11-16 NOTE — Progress Notes (Signed)
HEMATOLOGY-ONCOLOGY TeleHEALTH VISIT PROGRESS NOTE  I connected with Eileen Ritter on 04/28/19 at  9:30 AM EST by video enabled telemedicine visit and verified that I am speaking with the correct person using two identifiers. I discussed the limitations, risks, security and privacy concerns of performing an evaluation and management service by telemedicine and the availability of in-person appointments. I also discussed with the patient that there may be a patient responsible charge related to this service. The patient expressed understanding and agreed to proceed.   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   INTERVAL HISTORY Eileen Ritter is a 53 y.o. female who has above history reviewed by me today presents for follow up visit for management of neuroendocrine carcinoma. Problems and complaints are listed below:  Patient underwent US at [facility] on 12/01/2017 duodenal polyp/mass was submucosal on EUS about 1 cm.  A band was placed at the base of the polyp biopsy was taken from the polyp which demonstrated well-differentiated grade 1 neuroendocrine carcinoma.  Patient was told that mass was expected to drop off.  She had a repeat EGD on 06/13/2018 which showed a single 10 mm sessile polyp with no bleeding found in the duodenal bulb.  Patient was referred back to [facility].  Patient underwent EGD at [facility] on 10/10/2018. duodenum submucosal nodule 33mm was seen and was banded again, no specimen was collected.   She was supposed to follow-up with [facility] gastroenterology 1 year after this procedure.  Patient has a history of iron deficiency anemia and she had taken oral iron supplementation. Today she reports doing well.  No new complaints.  Denies any skin rash, facial flush, diarrhea.  Denies any pain today.  INTERVAL  HISTORY Eileen Ritter is a 53 y.o. female who has above history reviewed by me today presents for follow up visit for management of history of neuroendocrine carcinoma. Problems and complaints are listed below: Patient reports feeling well.  She has no new complaints today. Denies any constitutional symptoms.  Denies any pain, abdominal discomfort, change of bowel movement habits She is due for repeat EGD and [facility].  Due to Covid pandemic's, patient prefers to delay for a few months.   Review of Systems  Constitutional: Negative for appetite change, chills, fatigue and fever.  HENT:   Negative for hearing loss and voice change.   Eyes: Negative for eye problems.  Respiratory: Negative for chest tightness and cough.   Cardiovascular: Negative for chest pain.  Gastrointestinal: Negative for abdominal distention, abdominal pain and blood in stool.  Endocrine: Negative for hot flashes.  Genitourinary: Negative for difficulty urinating and frequency.   Musculoskeletal: Negative for arthralgias.  Skin: Negative for itching and rash.  Neurological: Negative for extremity weakness.  Hematological: Negative for adenopathy.  Psychiatric/Behavioral: Negative for confusion.    Past Medical History:  Diagnosis Date  . Allergic rhinitis   . Anxiety   . Chronic kidney disease   . Depression   . Diabetes ([place])   . GERD (gastroesophageal reflux disease)   . HBP (high blood pressure)   . High cholesterol   . Iron deficiency anemia due to chronic blood loss 01/13/2018  . Obesity    Past Surgical History:  Procedure Laterality Date  . APPENDECTOMY    . COLONOSCOPY WITH PROPOFOL N/A 09/23/2017   Procedure: COLONOSCOPY WITH PROPOFOL;  Surgeon: Vicente Males,  Bailey Mech, MD;  Location: [place];  Service: Gastroenterology;  Laterality: N/A;  . CYST EXCISION     back  . ESOPHAGOGASTRODUODENOSCOPY (EGD) WITH PROPOFOL N/A 09/23/2017   Procedure: ESOPHAGOGASTRODUODENOSCOPY (EGD) WITH PROPOFOL;   Surgeon: Jonathon Bellows, MD;  Location: [facility] ENDOSCOPY;  Service: Gastroenterology;  Laterality: N/A;  . ESOPHAGOGASTRODUODENOSCOPY (EGD) WITH PROPOFOL N/A 06/13/2018   Procedure: ESOPHAGOGASTRODUODENOSCOPY (EGD) WITH PROPOFOL;  Surgeon: Jonathon Bellows, MD;  Location: [facility]COPY;  Service: Gastroenterology;  Laterality: N/A;  . GIVENS CAPSULE STUDY N/A 07/20/2018   Procedure: GIVENS CAPSULE STUDY;  Surgeon: Jonathon Bellows, MD;  Location: [facility] ENDOSCOPY;  Service: Gastroenterology;  Laterality: N/A;  . TONSILLECTOMY    . TUBAL LIGATION      Family History  Problem Relation Age of Onset  . Diabetes Mother   . Heart disease Mother   . Hypertension Mother   . Stroke Mother   . Cancer Mother        LUNG  . Mental illness Mother   . Thyroid disease Mother   . Diabetes Father   . Heart disease Father   . Diabetes Sister   . Diabetes Brother   . Heart disease Brother   . Heart disease Maternal Grandmother   . Breast cancer Neg Hx     Social History   Socioeconomic History  . Marital status: Single    Spouse name: Not on file  . Number of children: Not on file  . Years of education: Not on file  . Highest education level: Not on file  Occupational History  . Not on file  Social Needs  . Financial resource strain: Not on file  . Food insecurity    Worry: Not on file    Inability: Not on file  . Transportation needs    Medical: Not on file    Non-medical: Not on file  Tobacco Use  . Smoking status: Former Smoker    Quit date: 11/01/2013    Years since quitting: 6.0  . Smokeless tobacco: Never Used  Substance and Sexual Activity  . Alcohol use: No  . Drug use: No  . Sexual activity: Yes  Lifestyle  . Physical activity    Days per week: Not on file    Minutes per session: Not on file  . Stress: Not on file  Relationships  . Social Herbalist on phone: Not on file    Gets together: Not on file    Attends religious service: Not on file    Active member of club or  organization: Not on file    Attends meetings of clubs or organizations: Not on file    Relationship status: Not on file  . Intimate partner violence    Fear of current or ex partner: Not on file    Emotionally abused: Not on file    Physically abused: Not on file    Forced sexual activity: Not on file  Other Topics Concern  . Not on file  Social History Narrative  . Not on file    Current Outpatient Medications on File Prior to Visit  Medication Sig Dispense Refill  . Ascorbic Acid (VITAMIN C) 100 MG tablet Take 100 mg by mouth daily.    Marland Kitchen atorvastatin (LIPITOR) 40 MG tablet TAKE 1 TABLET BY MOUTH EVERY DAY 90 tablet 1  . buPROPion (WELLBUTRIN SR) 150 MG 12 hr tablet TAKE 1 TABLET BY MOUTH EVERY DAY 90 tablet 0  . Cetirizine HCl (ZYRTEC ALLERGY PO) Take  by mouth daily. 10 mg    . Cholecalciferol (VITAMIN D) 2000 units tablet Take 2,000 Units by mouth daily.    . clonazePAM (KLONOPIN) 0.5 MG tablet Take 1 tablet (0.5 mg total) by mouth daily as needed for anxiety. 30 tablet 0  . cyclobenzaprine (FLEXERIL) 10 MG tablet Take 1 tablet (10 mg total) by mouth as needed for muscle spasms. 90 tablet 1  . diclofenac sodium (VOLTAREN) 1 % GEL Apply 2 g topically 4 (four) times daily. 100 g 1  . Ferrous Sulfate Dried (SLOW RELEASE IRON) 45 MG TBCR Take 45 mg by mouth daily.    . Fluoxetine HCl, PMDD, 20 MG TABS TAKE 1 TABLET BY MOUTH EVERYDAY AT BEDTIME 90 tablet 1  . fluticasone (FLONASE) 50 MCG/ACT nasal spray Place 2 sprays into both nostrils daily.    Marland Kitchen glucose blood test strip Use as instructed 100 each 12  . losartan (COZAAR) 50 MG tablet TAKE 1 TABLET BY MOUTH EVERY DAY 90 tablet 1  . metFORMIN (GLUCOPHAGE) 500 MG tablet TAKE 2 TABLETS BY MOUTH TWICE A DAY WITH A MEAL 180 tablet 0  . montelukast (SINGULAIR) 10 MG tablet TAKE 1 TABLET BY MOUTH EVERYDAY AT BEDTIME 90 tablet 1  . ondansetron (ZOFRAN) 4 MG tablet Take 1 tablet (4 mg total) by mouth every 8 (eight) hours as needed for nausea or  vomiting. 20 tablet 0  . vitamin B-12 (CYANOCOBALAMIN) 100 MCG tablet Take 100 mcg by mouth daily.    Marland Kitchen gabapentin (NEURONTIN) 300 MG capsule Take 1 capsule (300 mg total) by mouth 3 (three) times daily as needed. (Patient not taking: Reported on 11/14/2019) 90 capsule 1  . glipiZIDE (GLUCOTROL) 5 MG tablet Take 1 tablet (5 mg total) by mouth 2 (two) times daily before a meal. (Patient not taking: Reported on 11/14/2019) 60 tablet 2  . omeprazole (PRILOSEC) 20 MG capsule TAKE 1 CAPSULE BY MOUTH EVERY DAY 90 capsule 1  . Probiotic Product (PROBIOTIC PO) Take 1 tablet by mouth daily.    . TRULICITY [id number] SOPN INJECT 0.75 MG INTO THE SKIN ONCE A WEEK. 2 pen 2   No current facility-administered medications on file prior to visit.     No Known Allergies     Observations/Objective: There were no vitals filed for this visit. There is no height or weight on file to calculate BMI.  Physical Exam  Constitutional: She is oriented to person, place, and time. No distress.  HENT:  Head: Normocephalic and atraumatic.  Pulmonary/Chest: Effort normal.  Neurological: She is alert and oriented to person, place, and time.  Psychiatric: Mood normal.    CBC    Component Value Date/Time   WBC 9.9 10/30/2019 1007   RBC 4.92 10/30/2019 1007   HGB 13.2 10/30/2019 1007   HGB 13.9 09/04/2019 0954   HCT 40.2 10/30/2019 1007   HCT 41.6 09/04/2019 0954   PLT 316 10/30/2019 1007   PLT 306 09/04/2019 0954   MCV 81.7 10/30/2019 1007   MCV 83 09/04/2019 0954   MCV 80 07/22/2013 1849   MCH 26.8 10/30/2019 1007   MCHC 32.8 10/30/2019 1007   RDW 13.1 10/30/2019 1007   RDW 14.3 09/04/2019 0954   RDW 14.1 07/22/2013 1849   LYMPHSABS 3.6 10/30/2019 1007   LYMPHSABS 3.2 (H) 09/04/2019 0954   MONOABS 0.8 10/30/2019 1007   EOSABS 0.3 10/30/2019 1007   EOSABS 0.2 09/04/2019 0954   BASOSABS 0.1 10/30/2019 1007   BASOSABS 0.1 09/04/2019 0954  CMP     Component Value Date/Time   NA 138 09/04/2019 0954    NA 138 07/22/2013 1849   K 4.8 09/04/2019 0954   K 4.2 07/22/2013 1849   CL 99 09/04/2019 0954   CL 106 07/22/2013 1849   CO2 24 09/04/2019 0954   CO2 26 07/22/2013 1849   GLUCOSE 170 (H) 09/04/2019 0954   GLUCOSE 215 (H) 05/23/2019 1757   GLUCOSE 124 (H) 07/22/2013 1849   BUN 11 09/04/2019 0954   BUN 16 07/22/2013 1849   CREATININE 0.72 09/04/2019 0954   CREATININE 0.89 07/22/2013 1849   CALCIUM 9.3 09/04/2019 0954   CALCIUM 9.2 07/22/2013 1849   PROT 6.3 09/04/2019 0954   PROT 7.2 07/22/2013 1849   ALBUMIN 4.2 09/04/2019 0954   ALBUMIN 3.5 07/22/2013 1849   AST 20 09/04/2019 0954   AST 21 07/22/2013 1849   ALT 29 09/04/2019 0954   ALT 41 07/22/2013 1849   ALKPHOS 86 09/04/2019 0954   ALKPHOS 100 07/22/2013 1849   BILITOT 0.3 09/04/2019 0954   BILITOT 0.2 07/22/2013 1849   GFRNONAA 97 09/04/2019 0954   GFRNONAA >60 07/22/2013 1849   GFRAA 112 09/04/2019 0954   GFRAA >60 07/22/2013 1849     Assessment and Plan: 1. Neuroendocrine carcinoma ([place])   2. History of iron deficiency anemia    #History of Stage I [id number] Duodenum neuroendocrine carcinoma, grade 1, s/p banding.  Patient is due for EGD in October 2020.  Patient prefers to delay for few months due to the Covid pandemic. I suggest patient not to delay too long for surveillance. Chromogranin A 77.5, decreased from 6 months ago.  Continue monitor every 6 months.  #History of iron deficiency anemia.  Labs reviewed.  Stable hemoglobin Iron store is stable.  No need for iron supplementation at this point.   Labs are reviewed and discussed with patient.  Hemoglobin is stable and iron panel revealed acceptable iron store.  No need for IV iron treatment.   Follow Up Instructions: Lab MD in 6 months.   Orders Placed This Encounter  Procedures  . CBC with Differential/Platelet    Standing Status:   Future    Standing Expiration Date:   11/14/2020  . Ferritin    Standing Status:   Future    Standing Expiration  Date:   11/14/2020  . Iron and TIBC    Standing Status:   Future    Standing Expiration Date:   11/14/2020  . Chromogranin A    Standing Status:   Future    Standing Expiration Date:   11/14/2020     I discussed the assessment and treatment plan with the patient. The patient was provided an opportunity to ask questions and all were answered. The patient agreed with the plan and demonstrated an understanding of the instructions.  The patient was advised to call back or seek an in-person evaluation if the symptoms worsen or if the condition fails to improve as anticipated.   Earlie Server, MD 11/16/2019 7:32 PM

## 2019-11-23 ENCOUNTER — Other Ambulatory Visit: Payer: Self-pay | Admitting: Family Medicine

## 2019-11-23 NOTE — Telephone Encounter (Signed)
Pt request refill   metFORMIN (GLUCOPHAGE) 500 MG tablet [id number] [address], [place] Phone:  [phone]  Fax:  [phone]

## 2019-11-24 MED ORDER — METFORMIN HCL 500 MG PO TABS: ORAL_TABLET | ORAL | 0 refills | Status: DC

## 2019-12-01 ENCOUNTER — Other Ambulatory Visit: Payer: Self-pay | Admitting: Family Medicine

## 2019-12-01 NOTE — Telephone Encounter (Signed)
Forwarding medication refill requests to PCP for review. 

## 2019-12-04 ENCOUNTER — Encounter: Payer: Self-pay | Admitting: Family Medicine

## 2019-12-04 ENCOUNTER — Other Ambulatory Visit: Payer: Self-pay

## 2019-12-04 ENCOUNTER — Ambulatory Visit (INDEPENDENT_AMBULATORY_CARE_PROVIDER_SITE_OTHER): Payer: Commercial Managed Care - PPO | Admitting: Family Medicine

## 2019-12-04 VITALS — BP 136/83 | HR 66 | Temp 98.2°F | Ht 61.0 in | Wt 289.0 lb

## 2019-12-04 DIAGNOSIS — E1165 Type 2 diabetes mellitus with hyperglycemia: Secondary | ICD-10-CM

## 2019-12-04 DIAGNOSIS — F3341 Major depressive disorder, recurrent, in partial remission: Secondary | ICD-10-CM

## 2019-12-04 DIAGNOSIS — M170 Bilateral primary osteoarthritis of knee: Secondary | ICD-10-CM

## 2019-12-04 MED ORDER — BUPROPION HCL ER (SR) 150 MG PO TB12: 150.0000 mg | ORAL_TABLET | Freq: Every day | ORAL | 1 refills | Status: DC

## 2019-12-04 MED ORDER — METFORMIN HCL 1000 MG PO TABS: 1000.0000 mg | ORAL_TABLET | Freq: Two times a day (BID) | ORAL | 3 refills | Status: DC

## 2019-12-04 NOTE — Progress Notes (Signed)
BP 136/83   Pulse 66   Temp 98.2 F (36.8 C) (Oral)   Ht 5\' 1"  (1.549 m)   Wt 289 lb (131.1 kg)   SpO2 96%   BMI 54.61 kg/m    Subjective:    Patient ID: Eileen Ritter, female    DOB: [birth year], 53 y.o.   MRN: [id number]  HPI: Eileen Ritter is a 53 y.o. female  Chief Complaint  Patient presents with  . Diabetes   DM f/u - stopped the trulicity due to N/V, taking glipizide and metformin and sugars are now running 100-140 range. Also working on diet and activity. Knees are injured at the moment so not able to exercise as much as she would like.   Anxiety and depression - feels her stress is reduced with her new position. Still taking the prozac, wellbutrin and has klonopin around for prn use, which is rare. Still has some tabs left from her script 6 months ago. Trying to manage things with breathing exercises and changing her outlook on things. Denies SI/HI or side effects with medications.   GAD 7 : Generalized Anxiety Score 08/25/2018  Nervous, Anxious, on Edge 1  Control/stop worrying 1  Worry too much - different things 1  Trouble relaxing 1  Restless 0  Easily annoyed or irritable 1  Afraid - awful might happen 0  Total GAD 7 Score 5   Depression screen [facility] 2/9 12/04/2019 03/03/2019 08/25/2018  Decreased Interest 0 0 0  Down, Depressed, Hopeless 0 2 0  PHQ - 2 Score 0 2 0  Altered sleeping 0 1 1  Tired, decreased energy 0 1 1  Change in appetite 0 1 0  Feeling bad or failure about yourself  0 1 0  Trouble concentrating 0 0 0  Moving slowly or fidgety/restless 0 0 0  Suicidal thoughts 0 0 0  PHQ-9 Score 0 6 2  Some recent data might be hidden   Relevant past medical, surgical, family and social history reviewed and updated as indicated. Interim medical history since our last visit reviewed. Allergies and medications reviewed and updated.  Review of Systems  Per HPI unless specifically indicated above     Objective:    BP 136/83   Pulse 66    Temp 98.2 F (36.8 C) (Oral)   Ht 5\' 1"  (1.549 m)   Wt 289 lb (131.1 kg)   SpO2 96%   BMI 54.61 kg/m   Wt Readings from Last 3 Encounters:  12/04/19 289 lb (131.1 kg)  10/12/19 291 lb (132 kg)  09/04/19 294 lb 6.4 oz (133.5 kg)    Physical Exam Vitals and nursing note reviewed.  Constitutional:      Appearance: Normal appearance. She is not ill-appearing.  HENT:     Head: Atraumatic.  Eyes:     Extraocular Movements: Extraocular movements intact.     Conjunctiva/sclera: Conjunctivae normal.  Cardiovascular:     Rate and Rhythm: Normal rate and regular rhythm.     Heart sounds: Normal heart sounds.  Pulmonary:     Effort: Pulmonary effort is normal.     Breath sounds: Normal breath sounds.  Musculoskeletal:        General: Normal range of motion.     Cervical back: Normal range of motion and neck supple.  Skin:    General: Skin is warm and dry.  Neurological:     Mental Status: She is alert and oriented to person, place, and time.  Psychiatric:  Mood and Affect: Mood normal.        Thought Content: Thought content normal.        Judgment: Judgment normal.     Results for orders placed or performed in visit on 12/04/19  HgB A1c  Result Value Ref Range   Hgb A1c MFr Bld 7.9 (H) 4.8 - 5.6 %   Est. average glucose Bld gHb Est-mCnc 180 mg/dL      Assessment & Plan:   Problem List Items Addressed This Visit      Endocrine   Uncontrolled type 2 diabetes mellitus ([place]) - Primary    Pt notes relief of side effects after d/c of trulicity and that her BSs on home checks improved with glipizide. Will recheck A1C and give this regimen 3 more months to monitor efficacy. Continue working on diet and exercise modifications for further control. Continue current regimen      Relevant Medications   metFORMIN (GLUCOPHAGE) 1000 MG tablet   Other Relevant Orders   HgB A1c (Completed)     Musculoskeletal and Integument   Arthritis of knee, degenerative    Exacerbated,  working on weight loss so she can be a candidate for knee replacement. Continue current regimen        Other   Depression    With anxiety. Improved with job change, fairly stable on current regimen. Continue current regimen with rare prn use of klonopin. 1 script should last at least 6 months.       Relevant Medications   buPROPion (WELLBUTRIN SR) 150 MG 12 hr tablet   Morbid obesity ([place])    Continue working on diet and exercise changes      Relevant Medications   metFORMIN (GLUCOPHAGE) 1000 MG tablet       Follow up plan: Return in about 3 months (around 03/03/2020) for DM.

## 2019-12-05 LAB — HEMOGLOBIN A1C
Est. average glucose Bld gHb Est-mCnc: 180 mg/dL
Hgb A1c MFr Bld: 7.9 % — ABNORMAL HIGH (ref 4.8–5.6)

## 2019-12-10 NOTE — Assessment & Plan Note (Signed)
Exacerbated, working on weight loss so she can be a candidate for knee replacement. Continue current regimen

## 2019-12-10 NOTE — Assessment & Plan Note (Signed)
Continue working on diet and exercise changes

## 2019-12-10 NOTE — Assessment & Plan Note (Signed)
Pt notes relief of side effects after d/c of trulicity and that her BSs on home checks improved with glipizide. Will recheck A1C and give this regimen 3 more months to monitor efficacy. Continue working on diet and exercise modifications for further control. Continue current regimen

## 2019-12-10 NOTE — Assessment & Plan Note (Signed)
With anxiety. Improved with job change, fairly stable on current regimen. Continue current regimen with rare prn use of klonopin. 1 script should last at least 6 months.

## 2020-01-17 ENCOUNTER — Encounter: Payer: Self-pay | Admitting: Family Medicine

## 2020-01-17 ENCOUNTER — Encounter: Payer: Self-pay | Admitting: Oncology

## 2020-01-24 ENCOUNTER — Encounter: Payer: Self-pay | Admitting: Family Medicine

## 2020-01-25 ENCOUNTER — Other Ambulatory Visit: Payer: Self-pay | Admitting: Family Medicine

## 2020-01-25 MED ORDER — DICLOFENAC SODIUM 1 % EX GEL: 2.0000 g | Freq: Four times a day (QID) | CUTANEOUS | 3 refills | Status: AC

## 2020-02-14 ENCOUNTER — Other Ambulatory Visit: Payer: Self-pay | Admitting: Family Medicine

## 2020-02-16 ENCOUNTER — Other Ambulatory Visit: Payer: Self-pay | Admitting: Family Medicine

## 2020-03-05 ENCOUNTER — Ambulatory Visit: Payer: Commercial Managed Care - PPO | Admitting: Family Medicine

## 2020-03-08 ENCOUNTER — Other Ambulatory Visit: Payer: Self-pay

## 2020-03-08 ENCOUNTER — Ambulatory Visit (INDEPENDENT_AMBULATORY_CARE_PROVIDER_SITE_OTHER): Payer: Commercial Managed Care - PPO | Admitting: Family Medicine

## 2020-03-08 ENCOUNTER — Encounter: Payer: Self-pay | Admitting: Family Medicine

## 2020-03-08 VITALS — BP 138/82 | HR 78 | Temp 98.1°F | Ht 61.0 in | Wt 290.0 lb

## 2020-03-08 DIAGNOSIS — I10 Essential (primary) hypertension: Secondary | ICD-10-CM

## 2020-03-08 DIAGNOSIS — E1165 Type 2 diabetes mellitus with hyperglycemia: Secondary | ICD-10-CM

## 2020-03-08 DIAGNOSIS — E782 Mixed hyperlipidemia: Secondary | ICD-10-CM

## 2020-03-08 DIAGNOSIS — F3341 Major depressive disorder, recurrent, in partial remission: Secondary | ICD-10-CM

## 2020-03-08 LAB — UA/M W/RFLX CULTURE, ROUTINE
Bilirubin, UA: NEGATIVE
Glucose, UA: NEGATIVE
Ketones, UA: NEGATIVE
Leukocytes,UA: NEGATIVE
Nitrite, UA: NEGATIVE
Protein,UA: NEGATIVE
RBC, UA: NEGATIVE
Specific Gravity, UA: 1.02 (ref 1.005–1.030)
Urobilinogen, Ur: 1 mg/dL (ref 0.2–1.0)
pH, UA: 7 (ref 5.0–7.5)

## 2020-03-08 MED ORDER — ATORVASTATIN CALCIUM 40 MG PO TABS: 40.0000 mg | ORAL_TABLET | Freq: Every day | ORAL | 1 refills | Status: DC

## 2020-03-08 MED ORDER — BUPROPION HCL ER (XL) 300 MG PO TB24: 300.0000 mg | ORAL_TABLET | Freq: Every day | ORAL | 1 refills | Status: DC

## 2020-03-08 MED ORDER — OMEPRAZOLE 20 MG PO CPDR: DELAYED_RELEASE_CAPSULE | ORAL | 1 refills | Status: DC

## 2020-03-08 MED ORDER — METFORMIN HCL 1000 MG PO TABS: 1000.0000 mg | ORAL_TABLET | Freq: Two times a day (BID) | ORAL | 3 refills | Status: DC

## 2020-03-08 MED ORDER — CLONAZEPAM 0.5 MG PO TABS: 0.5000 mg | ORAL_TABLET | Freq: Every day | ORAL | 0 refills | Status: AC | PRN

## 2020-03-08 MED ORDER — FLUOXETINE HCL 40 MG PO CAPS: 40.0000 mg | ORAL_CAPSULE | Freq: Every day | ORAL | 1 refills | Status: DC

## 2020-03-08 MED ORDER — GLIPIZIDE 5 MG PO TABS: 5.0000 mg | ORAL_TABLET | Freq: Two times a day (BID) | ORAL | 1 refills | Status: DC

## 2020-03-08 NOTE — Progress Notes (Signed)
BP 138/82   Pulse 78   Temp 98.1 F (36.7 C) (Oral)   Ht 5\' 1"  (1.549 m)   Wt 290 lb (131.5 kg)   SpO2 97%   BMI 54.80 kg/m    Subjective:    Patient ID: Eileen Ritter, female    DOB: 1966/02/11, 54 y.o.   MRN: [id number]  HPI: Eileen Ritter is a 54 y.o. female  Chief Complaint  Patient presents with  . Diabetes   Presenting today for 3 month f/u.   Was let go from her job of 20 years this month, struggling with the life change quite a bit. Has underlying anxiety and depression but this has exacerbated things. On low dose wellbutrin, prozac which previously has worked well for her. Denies SI/HI but having significant crying spells, sleeping difficulties, lack of motivation. Has 3 klonopin left from last fill 05/2019, previously was not really using much at all but since job situation has been having to use more frequently.   DM - Home BSs have been running 150-175 fasting and 2 hours post prandial around 120s. Has not been strict with diet or exercise. Taking medications faithfully without side effects. Denies low blood sugar spells.   HTN - BPs running 120-130s/80s. Taking medications faithfully without side effects. Denies CP, SOB, HAs, dizziness.   Relevant past medical, surgical, family and social history reviewed and updated as indicated. Interim medical history since our last visit reviewed. Allergies and medications reviewed and updated.  Review of Systems  Per HPI unless specifically indicated above     Objective:    BP 138/82   Pulse 78   Temp 98.1 F (36.7 C) (Oral)   Ht 5\' 1"  (1.549 m)   Wt 290 lb (131.5 kg)   SpO2 97%   BMI 54.80 kg/m   Wt Readings from Last 3 Encounters:  03/08/20 290 lb (131.5 kg)  12/04/19 289 lb (131.1 kg)  10/12/19 291 lb (132 kg)    Physical Exam Vitals and nursing note reviewed.  Constitutional:      Appearance: Normal appearance. She is not ill-appearing.  HENT:     Head: Atraumatic.  Eyes:   Extraocular Movements: Extraocular movements intact.     Conjunctiva/sclera: Conjunctivae normal.  Cardiovascular:     Rate and Rhythm: Normal rate and regular rhythm.     Heart sounds: Normal heart sounds.  Pulmonary:     Effort: Pulmonary effort is normal.     Breath sounds: Normal breath sounds.  Musculoskeletal:        General: Normal range of motion.     Cervical back: Normal range of motion and neck supple.  Skin:    General: Skin is warm and dry.  Neurological:     Mental Status: She is alert and oriented to person, place, and time.  Psychiatric:        Thought Content: Thought content normal.        Judgment: Judgment normal.     Comments: Tearful during interview     Results for orders placed or performed in visit on 03/08/20  HgB A1c  Result Value Ref Range   Hgb A1c MFr Bld 7.7 (H) 4.8 - 5.6 %   Est. average glucose Bld gHb Est-mCnc 174 mg/dL  CBC with Differential/Platelet  Result Value Ref Range   WBC 9.5 3.4 - 10.8 x10E3/uL   RBC 4.90 3.77 - 5.28 x10E6/uL   Hemoglobin 13.5 11.1 - 15.9 g/dL   Hematocrit 41.2 34.0 -  46.6 %   MCV 84 79 - 97 fL   MCH 27.6 26.6 - 33.0 pg   MCHC 32.8 31.5 - 35.7 g/dL   RDW 12.5 11.7 - 15.4 %   Platelets 324 150 - 450 x10E3/uL   Neutrophils 56 Not Estab. %   Lymphs 31 Not Estab. %   Monocytes 8 Not Estab. %   Eos 4 Not Estab. %   Basos 1 Not Estab. %   Neutrophils Absolute 5.3 1.4 - 7.0 x10E3/uL   Lymphocytes Absolute 2.9 0.7 - 3.1 x10E3/uL   Monocytes Absolute 0.7 0.1 - 0.9 x10E3/uL   EOS (ABSOLUTE) 0.4 0.0 - 0.4 x10E3/uL   Basophils Absolute 0.1 0.0 - 0.2 x10E3/uL   Immature Granulocytes 0 Not Estab. %   Immature Grans (Abs) 0.0 0.0 - 0.1 x10E3/uL  Comprehensive metabolic panel  Result Value Ref Range   Glucose 178 (H) 65 - 99 mg/dL   BUN 14 6 - 24 mg/dL   Creatinine, Ser 0.65 0.57 - 1.00 mg/dL   GFR calc non Af Amer 103 >59 mL/min/1.73   GFR calc Af Amer 119 >59 mL/min/1.73   BUN/Creatinine Ratio 22 9 - 23   Sodium  139 134 - 144 mmol/L   Potassium 4.5 3.5 - 5.2 mmol/L   Chloride 99 96 - 106 mmol/L   CO2 25 20 - 29 mmol/L   Calcium 9.3 8.7 - 10.2 mg/dL   Total Protein 6.5 6.0 - 8.5 g/dL   Albumin 4.2 3.8 - 4.9 g/dL   Globulin, Total 2.3 1.5 - 4.5 g/dL   Albumin/Globulin Ratio 1.8 1.2 - 2.2   Bilirubin Total 0.2 0.0 - 1.2 mg/dL   Alkaline Phosphatase 85 39 - 117 IU/L   AST 18 0 - 40 IU/L   ALT 31 0 - 32 IU/L  Lipid Panel w/o Chol/HDL Ratio  Result Value Ref Range   Cholesterol, Total 158 100 - 199 mg/dL   Triglycerides 101 0 - 149 mg/dL   HDL 53 >39 mg/dL   VLDL Cholesterol Cal 18 5 - 40 mg/dL   LDL Chol Calc (NIH) 87 0 - 99 mg/dL  TSH  Result Value Ref Range   TSH 4.050 0.450 - 4.500 uIU/mL  UA/M w/rflx Culture, Routine   Specimen: Urine   URINE  Result Value Ref Range   Specific Gravity, UA 1.020 1.005 - 1.030   pH, UA 7.0 5.0 - 7.5   Color, UA Yellow Yellow   Appearance Ur Clear Clear   Leukocytes,UA Negative Negative   Protein,UA Negative Negative/Trace   Glucose, UA Negative Negative   Ketones, UA Negative Negative   RBC, UA Negative Negative   Bilirubin, UA Negative Negative   Urobilinogen, Ur 1.0 0.2 - 1.0 mg/dL   Nitrite, UA Negative Negative      Assessment & Plan:   Problem List Items Addressed This Visit      Cardiovascular and Mediastinum   HTN (hypertension)    BPs stable and WNL, continue current regimen      Relevant Medications   atorvastatin (LIPITOR) 40 MG tablet   Other Relevant Orders   CBC with Differential/Platelet (Completed)   TSH (Completed)   UA/M w/rflx Culture, Routine (Completed)     Endocrine   Uncontrolled type 2 diabetes mellitus ([place])    Recheck A1C, adjust as needed. Continue working on diet and exercise and current regimen      Relevant Medications   atorvastatin (LIPITOR) 40 MG tablet   glipiZIDE (GLUCOTROL) 5 MG  tablet   metFORMIN (GLUCOPHAGE) 1000 MG tablet   Other Relevant Orders   HgB A1c (Completed)     Other    Hyperlipidemia    Recheck lipids, adjust as needed. Continue current regimen and work on diet and exercise changes      Relevant Medications   atorvastatin (LIPITOR) 40 MG tablet   Other Relevant Orders   Comprehensive metabolic panel (Completed)   Lipid Panel w/o Chol/HDL Ratio (Completed)   Depression - Primary    Exacerbated by job loss, increase prozac and wellbutrin, discussed counseling      Relevant Medications   buPROPion (WELLBUTRIN XL) 300 MG 24 hr tablet   FLUoxetine (PROZAC) 40 MG capsule   Morbid obesity ([place])    Discussed at length diet and exercise changes      Relevant Medications   glipiZIDE (GLUCOTROL) 5 MG tablet   metFORMIN (GLUCOPHAGE) 1000 MG tablet   Other Relevant Orders   TSH (Completed)       Follow up plan: Return for 3-6 months based on insurance situation.

## 2020-03-09 LAB — COMPREHENSIVE METABOLIC PANEL
ALT: 31 IU/L (ref 0–32)
AST: 18 IU/L (ref 0–40)
Albumin/Globulin Ratio: 1.8 (ref 1.2–2.2)
Albumin: 4.2 g/dL (ref 3.8–4.9)
Alkaline Phosphatase: 85 IU/L (ref 39–117)
BUN/Creatinine Ratio: 22 (ref 9–23)
BUN: 14 mg/dL (ref 6–24)
Bilirubin Total: 0.2 mg/dL (ref 0.0–1.2)
CO2: 25 mmol/L (ref 20–29)
Calcium: 9.3 mg/dL (ref 8.7–10.2)
Chloride: 99 mmol/L (ref 96–106)
Creatinine, Ser: 0.65 mg/dL (ref 0.57–1.00)
GFR calc Af Amer: 119 mL/min/{1.73_m2} (ref >59)
GFR calc non Af Amer: 103 mL/min/{1.73_m2} (ref >59)
Globulin, Total: 2.3 g/dL (ref 1.5–4.5)
Glucose: 178 mg/dL — ABNORMAL HIGH (ref 65–99)
Potassium: 4.5 mmol/L (ref 3.5–5.2)
Sodium: 139 mmol/L (ref 134–144)
Total Protein: 6.5 g/dL (ref 6.0–8.5)

## 2020-03-09 LAB — LIPID PANEL W/O CHOL/HDL RATIO
Cholesterol, Total: 158 mg/dL (ref 100–199)
HDL: 53 mg/dL (ref >39)
LDL Chol Calc (NIH): 87 mg/dL (ref 0–99)
Triglycerides: 101 mg/dL (ref 0–149)
VLDL Cholesterol Cal: 18 mg/dL (ref 5–40)

## 2020-03-09 LAB — CBC WITH DIFFERENTIAL/PLATELET
Basophils Absolute: 0.1 10*3/uL (ref 0.0–0.2)
Basos: 1 %
EOS (ABSOLUTE): 0.4 10*3/uL (ref 0.0–0.4)
Eos: 4 %
Hematocrit: 41.2 % (ref 34.0–46.6)
Hemoglobin: 13.5 g/dL (ref 11.1–15.9)
Immature Grans (Abs): 0 10*3/uL (ref 0.0–0.1)
Immature Granulocytes: 0 %
Lymphocytes Absolute: 2.9 10*3/uL (ref 0.7–3.1)
Lymphs: 31 %
MCH: 27.6 pg (ref 26.6–33.0)
MCHC: 32.8 g/dL (ref 31.5–35.7)
MCV: 84 fL (ref 79–97)
Monocytes Absolute: 0.7 10*3/uL (ref 0.1–0.9)
Monocytes: 8 %
Neutrophils Absolute: 5.3 10*3/uL (ref 1.4–7.0)
Neutrophils: 56 %
Platelets: 324 10*3/uL (ref 150–450)
RBC: 4.9 x10E6/uL (ref 3.77–5.28)
RDW: 12.5 % (ref 11.7–15.4)
WBC: 9.5 10*3/uL (ref 3.4–10.8)

## 2020-03-09 LAB — HEMOGLOBIN A1C
Est. average glucose Bld gHb Est-mCnc: 174 mg/dL
Hgb A1c MFr Bld: 7.7 % — ABNORMAL HIGH (ref 4.8–5.6)

## 2020-03-09 LAB — TSH: TSH: 4.05 u[IU]/mL (ref 0.450–4.500)

## 2020-03-11 NOTE — Assessment & Plan Note (Signed)
Discussed at length diet and exercise changes

## 2020-03-11 NOTE — Assessment & Plan Note (Signed)
Exacerbated by job loss, increase prozac and wellbutrin, discussed counseling

## 2020-03-11 NOTE — Assessment & Plan Note (Signed)
BPs stable and WNL, continue current regimen 

## 2020-03-11 NOTE — Assessment & Plan Note (Signed)
Recheck lipids, adjust as needed. Continue current regimen and work on diet and exercise changes

## 2020-03-11 NOTE — Assessment & Plan Note (Signed)
Recheck A1C, adjust as needed. Continue working on diet and exercise and current regimen

## 2020-05-14 ENCOUNTER — Inpatient Hospital Stay: Payer: Self-pay | Attending: Oncology

## 2020-05-15 ENCOUNTER — Other Ambulatory Visit: Payer: Self-pay | Admitting: Family Medicine

## 2020-05-21 ENCOUNTER — Inpatient Hospital Stay: Payer: Self-pay | Admitting: Oncology

## 2020-06-04 ENCOUNTER — Other Ambulatory Visit: Payer: Self-pay | Admitting: Family Medicine

## 2020-06-11 ENCOUNTER — Encounter: Payer: Self-pay | Admitting: Family Medicine

## 2020-06-13 ENCOUNTER — Ambulatory Visit: Payer: Self-pay | Admitting: Family Medicine

## 2020-08-05 ENCOUNTER — Encounter: Payer: Self-pay | Admitting: Family Medicine

## 2020-08-06 ENCOUNTER — Other Ambulatory Visit: Payer: Self-pay | Admitting: Family Medicine

## 2020-08-07 ENCOUNTER — Other Ambulatory Visit: Payer: Self-pay

## 2020-08-08 MED ORDER — BUPROPION HCL ER (XL) 300 MG PO TB24: 300.0000 mg | ORAL_TABLET | Freq: Every day | ORAL | 0 refills | Status: DC

## 2020-08-08 MED ORDER — FLUOXETINE HCL 40 MG PO CAPS: 40.0000 mg | ORAL_CAPSULE | Freq: Every day | ORAL | 0 refills | Status: DC

## 2020-08-09 ENCOUNTER — Other Ambulatory Visit: Payer: Self-pay

## 2020-08-09 MED ORDER — BUPROPION HCL ER (XL) 300 MG PO TB24: 300.0000 mg | ORAL_TABLET | Freq: Every day | ORAL | 0 refills | Status: DC

## 2020-08-09 MED ORDER — FLUOXETINE HCL 40 MG PO CAPS: 40.0000 mg | ORAL_CAPSULE | Freq: Every day | ORAL | 0 refills | Status: DC

## 2020-08-12 ENCOUNTER — Other Ambulatory Visit: Payer: Self-pay | Admitting: Family Medicine

## 2020-08-12 MED ORDER — GLIPIZIDE 5 MG PO TABS: 5.0000 mg | ORAL_TABLET | Freq: Two times a day (BID) | ORAL | 1 refills | Status: AC

## 2020-08-12 MED ORDER — ATORVASTATIN CALCIUM 40 MG PO TABS: 40.0000 mg | ORAL_TABLET | Freq: Every day | ORAL | 1 refills | Status: AC

## 2020-08-12 MED ORDER — METFORMIN HCL 1000 MG PO TABS: 1000.0000 mg | ORAL_TABLET | Freq: Two times a day (BID) | ORAL | 3 refills | Status: AC

## 2020-08-12 MED ORDER — OMEPRAZOLE 20 MG PO CPDR: DELAYED_RELEASE_CAPSULE | ORAL | 1 refills | Status: AC

## 2020-08-12 NOTE — Telephone Encounter (Signed)
Pt lives in [place] and needs refills on atorvastain 40 mg #90, glipizide 5 mg #180, metformin 1000 mg #180, omeprazole 20 mg #90 and losartan 50 mg #90 with refills . These rxs needs to be sent to [facility] under md name not pa. [facility] [place]

## 2020-08-12 NOTE — Telephone Encounter (Signed)
Previous Eileen Ritter patient, needs medication sent my physician since they are out of state.

## 2020-09-02 ENCOUNTER — Other Ambulatory Visit: Payer: Self-pay | Admitting: Family Medicine

## 2020-09-12 ENCOUNTER — Telehealth: Payer: Self-pay | Admitting: Family Medicine

## 2020-09-12 NOTE — Telephone Encounter (Signed)
Called Korin advised her that we did send over records and resent them. She states that they did receive them  Copied from [place] [phone]. Topic: General - Other >> Sep 10, 2020  2:14 PM Leward Quan A wrote: Reason for CRM: Korin with [facility] in [place] called in to check if a medical record request was received that was faxed over on 09/05/20 asking for a call back to confirm receipt of this request. Please call Korin at Ph# [phone]

## 2020-11-05 ENCOUNTER — Other Ambulatory Visit: Payer: Self-pay | Admitting: Family Medicine

## 2020-11-05 NOTE — Telephone Encounter (Signed)
Patient called, left VM to return the call to the office to schedule with a new provider.

## 2020-11-05 NOTE — Telephone Encounter (Signed)
Requested medication (s) are due for refill today: Yes  Requested medication (s) are on the active medication list: Yes  Last refill:  09/02/20  Future visit scheduled: No  Notes to clinic: Unable to refill, appointment needed     Requested Prescriptions  Pending Prescriptions Disp Refills   FLUoxetine (PROZAC) 40 MG capsule [Pharmacy Med Name: FLUOXETINE HCL 40 MG CAPSULE] 90 capsule 1    Sig: TAKE 1 [place]      Psychiatry:  Antidepressants - SSRI Failed - 11/05/2020  5:26 PM      Failed - Valid encounter within last 6 months    Recent Outpatient Visits           8 months ago Recurrent major depressive disorder, in partial remission [facility])   [facility] Merrie Roof [place], [place]   11 months ago Uncontrolled type 2 diabetes mellitus with hyperglycemia [facility])   [facility] Volney [place], [place]   1 year ago Arthralgia, unspecified joint   [facility] Volney [place], [place]   1 year ago Annual physical exam   [facility] Volney [place], [place]   1 year ago Uncontrolled type 2 diabetes mellitus with hyperglycemia [facility]   [place], [place]              Passed - Completed PHQ-2 or PHQ-9 in the last 360 days

## 2020-11-06 NOTE — Telephone Encounter (Signed)
Please see RX request.   

## 2021-03-20 ENCOUNTER — Other Ambulatory Visit: Payer: Self-pay | Admitting: Family Medicine

## 2021-03-20 NOTE — Telephone Encounter (Signed)
   Notes to clinic Is this pt still associated with this practice? A note from states she moved to another state. Please assess.

## 2021-03-20 NOTE — Telephone Encounter (Signed)
Called pt no answer left vm to see if she has moved back

## 2021-03-21 NOTE — Telephone Encounter (Signed)
Unable to lvm to see if she has moved back.

## 2021-03-24 ENCOUNTER — Encounter: Payer: Self-pay | Admitting: Nurse Practitioner

## 2021-03-24 NOTE — Telephone Encounter (Signed)
Pt number is disconnected.Will send letter .

## 2021-03-24 NOTE — Telephone Encounter (Signed)
FYI UNABLE TO DONE MESSAGE DUE TO UNSIGNED ORDERS.

## 2021-03-27 ENCOUNTER — Other Ambulatory Visit: Payer: Self-pay | Admitting: Family Medicine

## 2021-03-27 NOTE — Telephone Encounter (Signed)
Lvm to see if pt is still a pt here as she had stated she was moving to [place] attempts have been made to reach pt

## 2021-03-27 NOTE — Telephone Encounter (Addendum)
Refill request for Losartan denied on 03/20/21 with note pt needs appt; letter mailed to pt on 03/24/21; no upcoming appts noted; pt last seen in office 03/08/20 by Merrie Roof; attempted to contact pt; left message on voicemail for pt to call office; will route to office for notification.

## 2021-04-01 NOTE — Telephone Encounter (Signed)
Lvm to see if pt is still a pt here as she had stated she was moving to [place] attempts have been made to reach pt

## 2021-04-02 NOTE — Telephone Encounter (Signed)
Unable to reach pt
